# Patient Record
Sex: Female | Born: 1940 | Race: White | Hispanic: No | Marital: Married | Smoking: Never smoker
Health system: Southern US, Community
[De-identification: ages and names within clinical notes are randomized; demographics above are authoritative.]

## PROBLEM LIST (undated history)

## (undated) DIAGNOSIS — I10 Essential (primary) hypertension: Secondary | ICD-10-CM

## (undated) DIAGNOSIS — K5792 Diverticulitis of intestine, part unspecified, without perforation or abscess without bleeding: Secondary | ICD-10-CM

## (undated) DIAGNOSIS — M1990 Unspecified osteoarthritis, unspecified site: Secondary | ICD-10-CM

## (undated) DIAGNOSIS — K635 Polyp of colon: Secondary | ICD-10-CM

## (undated) DIAGNOSIS — B019 Varicella without complication: Secondary | ICD-10-CM

## (undated) DIAGNOSIS — E119 Type 2 diabetes mellitus without complications: Secondary | ICD-10-CM

## (undated) DIAGNOSIS — T7840XA Allergy, unspecified, initial encounter: Secondary | ICD-10-CM

## (undated) DIAGNOSIS — N189 Chronic kidney disease, unspecified: Secondary | ICD-10-CM

## (undated) HISTORY — DX: Varicella without complication: B01.9

## (undated) HISTORY — DX: Diverticulitis of intestine, part unspecified, without perforation or abscess without bleeding: K57.92

## (undated) HISTORY — DX: Chronic kidney disease, unspecified: N18.9

## (undated) HISTORY — DX: Unspecified osteoarthritis, unspecified site: M19.90

## (undated) HISTORY — DX: Essential (primary) hypertension: I10

## (undated) HISTORY — PX: BREAST SURGERY: SHX581

## (undated) HISTORY — DX: Polyp of colon: K63.5

## (undated) HISTORY — DX: Allergy, unspecified, initial encounter: T78.40XA

---

## 1999-08-14 ENCOUNTER — Encounter: Admission: RE | Admit: 1999-08-14 | Discharge: 1999-11-12 | Payer: Self-pay | Admitting: Emergency Medicine

## 1999-11-23 ENCOUNTER — Other Ambulatory Visit: Admission: RE | Admit: 1999-11-23 | Discharge: 1999-11-23 | Payer: Self-pay | Admitting: Obstetrics and Gynecology

## 2000-11-25 ENCOUNTER — Other Ambulatory Visit: Admission: RE | Admit: 2000-11-25 | Discharge: 2000-11-25 | Payer: Self-pay | Admitting: Obstetrics and Gynecology

## 2001-08-21 ENCOUNTER — Encounter: Payer: Self-pay | Admitting: Emergency Medicine

## 2001-08-21 ENCOUNTER — Encounter: Admission: RE | Admit: 2001-08-21 | Discharge: 2001-08-21 | Payer: Self-pay | Admitting: Emergency Medicine

## 2001-09-03 HISTORY — PX: KNEE ARTHROSCOPY: SHX127

## 2001-11-12 ENCOUNTER — Ambulatory Visit (HOSPITAL_COMMUNITY): Admission: RE | Admit: 2001-11-12 | Discharge: 2001-11-12 | Payer: Self-pay | Admitting: Emergency Medicine

## 2001-11-12 ENCOUNTER — Encounter: Payer: Self-pay | Admitting: Emergency Medicine

## 2001-11-26 ENCOUNTER — Ambulatory Visit (HOSPITAL_COMMUNITY): Admission: RE | Admit: 2001-11-26 | Discharge: 2001-11-26 | Payer: Self-pay | Admitting: Gastroenterology

## 2001-11-26 ENCOUNTER — Encounter (INDEPENDENT_AMBULATORY_CARE_PROVIDER_SITE_OTHER): Payer: Self-pay | Admitting: Specialist

## 2001-12-03 ENCOUNTER — Other Ambulatory Visit: Admission: RE | Admit: 2001-12-03 | Discharge: 2001-12-03 | Payer: Self-pay | Admitting: Obstetrics and Gynecology

## 2004-09-03 HISTORY — PX: OTHER SURGICAL HISTORY: SHX169

## 2005-08-24 ENCOUNTER — Encounter: Admission: RE | Admit: 2005-08-24 | Discharge: 2005-08-24 | Payer: Self-pay | Admitting: Emergency Medicine

## 2005-08-24 ENCOUNTER — Inpatient Hospital Stay (HOSPITAL_COMMUNITY): Admission: EM | Admit: 2005-08-24 | Discharge: 2005-08-29 | Payer: Self-pay | Admitting: Emergency Medicine

## 2005-09-26 ENCOUNTER — Encounter: Admission: RE | Admit: 2005-09-26 | Discharge: 2005-09-26 | Payer: Self-pay | Admitting: Emergency Medicine

## 2006-05-29 ENCOUNTER — Ambulatory Visit: Payer: Self-pay | Admitting: Internal Medicine

## 2006-06-05 ENCOUNTER — Ambulatory Visit: Payer: Self-pay

## 2006-06-05 ENCOUNTER — Encounter: Payer: Self-pay | Admitting: Cardiology

## 2006-09-03 HISTORY — PX: DILATION AND CURETTAGE OF UTERUS: SHX78

## 2007-03-19 ENCOUNTER — Encounter: Admission: RE | Admit: 2007-03-19 | Discharge: 2007-03-19 | Payer: Self-pay | Admitting: Emergency Medicine

## 2007-04-09 ENCOUNTER — Ambulatory Visit (HOSPITAL_BASED_OUTPATIENT_CLINIC_OR_DEPARTMENT_OTHER): Admission: RE | Admit: 2007-04-09 | Discharge: 2007-04-09 | Payer: Self-pay | Admitting: Obstetrics and Gynecology

## 2007-04-09 ENCOUNTER — Encounter (INDEPENDENT_AMBULATORY_CARE_PROVIDER_SITE_OTHER): Payer: Self-pay | Admitting: Obstetrics and Gynecology

## 2007-07-09 ENCOUNTER — Encounter: Payer: Self-pay | Admitting: Internal Medicine

## 2007-07-09 ENCOUNTER — Ambulatory Visit: Payer: Self-pay

## 2010-02-16 LAB — HM MAMMOGRAPHY

## 2011-01-16 NOTE — Op Note (Signed)
NAME:  Kelly Blake, Kelly Blake NO.:  [id number]   MEDICAL RECORD NO.:  [id number]          PATIENT TYPE:  AMB   LOCATION:  [facility]                         FACILITY:  [facility]   PHYSICIAN:  Malachi Pro. Ambrose Mantle, M.D. DATE OF BIRTH:  Dec 28, 1940   DATE OF PROCEDURE:  04/09/2007  DATE OF DISCHARGE:                               OPERATIVE REPORT   PREOPERATIVE DIAGNOSIS:  Postmenopausal bleeding, endometrial lesions,  submucous fibroid versus polyp.   POSTOPERATIVE DIAGNOSIS:  Submucous fibroid.   OPERATION:  D&C, hysteroscopic partial resection of a submucous fibroid.   OPERATOR:  Malachi Pro. Ambrose Mantle, M.D.   ANESTHESIA:  General anesthesia.   The patient was brought to the operating room and placed under  satisfactory general anesthesia, placed in lithotomy position.  Exam  revealed some pelvic relaxation, with a small rectocele.  The cervix was  atrophic.  The uterus was probably normal size.  The adnexa were free of  masses.  The vulva, vagina, and perineum were prepped with Betadine  solution and draped as a sterile field.  A weighted speculum was placed  posteriorly.  The cervix was grasped with a tenaculum, and the uterus  which was posterior was sounded with the smallest dilator.  It was then  dilated up to reasonable size so that we could admit the hysteroscope.  On visualizing the endometrial cavity, it was apparent that the patient  had a large structure filling the endometrial cavity and seemed to be  arising from the right lateral wall of the endometrial cavity.  I did a  fractional D&C, and then I dilated the cervix to a 35 Pratt dilator so  that I could insert the resectoscope.  I initially used a coag current  of 80, but that was insufficient, so I switched to a blend current of  110, and with that I did remove 25 fragments of the polyp, but its  position was difficult to get.  Since it was on the lateral wall, I had  more difficulty getting the resectoscope around the  body of the tumor.  I attempted to try to remove the hysteroscope as infrequently as  possible, but when I took the first two or three strips of tissue and  tried to recover them with the polyp forceps, I could not recover them,  so I did have to remove the hysteroscope after the resection.  Some of  the fragments were larger than others, but it was apparent that I could  not get the entire fibroid out, so some of the fibroid is left in place.  There was no significant bleeding at the end of the procedure.  The goal  of the surgery was not necessarily to remove the mass in the endometrial  cavity but to be positive that there was no malignancy.  When the  deficit reached 500, I stopped resecting and removed the hysteroscopic  instruments and terminated the procedure.  The patient seemed to  tolerate the procedure well and was returned to recovery in satisfactory  condition.      Malachi Pro. Ambrose Mantle, M.D.  Electronically  Signed     TFH/MEDQ  D:  04/09/2007  T:  04/09/2007  Job:  [id number]

## 2011-01-19 NOTE — Procedures (Signed)
[place]  Patient:    Kelly Blake, Kelly Blake Visit Number: [id number] MRN: [id number]          Service Type: END Location: ENDO Attending Physician:  Charna Elizabeth Dictated by:   Anselmo Rod, M.D. Proc. Date: 11/26/01 Admit Date:  11/26/2001   CC:         Reuben Likes, M.D.   Procedure Report  DATE OF BIRTH:  02-Dec-1940  REFERRING PHYSICIAN:  Reuben Likes, M.D.  PROCEDURE PERFORMED:  Colonoscopy with snare polypectomy x 1.  ENDOSCOPIST:  Anselmo Rod, M.D.  INSTRUMENT USED:  Olympus adjustable pediatric video colonoscope.  INDICATIONS FOR PROCEDURE:  Occasional blood in stool and history of recurrent left lower quadrant abdominal pain question diverticular disease in a 70 year old white female.  Rule out colonic polyps, masses, hemorrhoids, diverticulosis, etc.  PREPROCEDURE PREPARATION:  Informed consent was procured from the patient. The patient was fasted for eight hours prior to the procedure and prepped with a bottle of magnesium citrate and a gallon of NuLytely the night prior to the procedure.  PREPROCEDURE PHYSICAL:  The patient had stable vital signs.  Neck supple. Chest clear to auscultation.  S1, S2 regular.  Abdomen soft with normal bowel sounds.  DESCRIPTION OF PROCEDURE:  The patient was placed in the left lateral decubitus position and sedated with 60 mg of Demerol and 6 mg of Versed intravenously.  Once the patient was adequately sedated and maintained on low-flow oxygen and continuous cardiac monitoring, the Olympus video colonoscope was advanced from the rectum to the cecum without difficulty.  The appendiceal orifice and the ileocecal valve were clearly visualized and photographed.  A small flat polyp was snared by snare polypectomy forceps from the cecal base.  There were a few early left-sided diverticula appreciated on inspection of the left colon.  Small nonbleeding internal hemorrhoids  were appreciated on retroflexion in the rectum.  The rest of the colon appeared healthy and without lesions.  IMPRESSION: 1. Small nonbleeding internal hemorrhoids. 2. Early left-sided diverticulosis. 3. Flat polyp snared from the cecal base. 4. Otherwise normal-appearing right colon and transverse colon.  RECOMMENDATIONS: 1. Avoid all nonsteroidals for the next four weeks. 2. Await pathology results. 3. Outpatient follow-up in the next two weeks or earlier if need be. Dictated by:   Anselmo Rod, M.D. Attending Physician:  Charna Elizabeth DD:  11/26/01 TD:  11/26/01 Job: [id number]

## 2011-01-19 NOTE — Letter (Signed)
May 29, 2006     Reuben Likes, M.D.  [address]  [place], [place]  [address]   RE:  Blake, Kelly  MRN:  [id number]  /  DOB:  1940/10/22   Dear Onalee Hua,   It was a pleasure to talk to you today about Kelly Blake.  I am  grateful that you are willing to share with me some of what has been going  on with the evaluation of your son.   Kelly Blake, as you know, is a delightful 70 year old woman who was found  to have PVCs after Kelly Blake developed transient episodes of left-sided sharp  chest pain, each lasting for about five or 10 seconds, radiating out to her  left arm.  They then abated.  Kelly Blake then had sort of a diffuse ache that was  24/7, for about four or five days prior to her seeing you.  At that point  the electrocardiogram was done, demonstrating the PVCs.  A Holter monitor  was done which demonstrated that Kelly Blake had up to 10% of her beats being PVCs  and maybe 20% that some of these were called aberrant.   The patient has no clearly associated symptoms.  Kelly Blake exercises six days a  week and has had no problems with shortness of breath or chest discomfort.  Kelly Blake has had no lightheadedness or palpitations and no syncope.   PAST MEDICAL HISTORY:  1. Kelly Blake does have a past medical history of hypertension, for which Kelly Blake has      been on hydrochlorothiazide 50 mg daily for a long time.  2. Allergies.  3. A bowel disorder.   PAST SURGICAL HISTORY:  Notable for knee surgery.   SOCIAL HISTORY:  Kelly Blake is married.  Kelly Blake comes with her husband today.  Kelly Blake has  three children.  Kelly Blake does not use cigarettes or recreational drugs.  Kelly Blake  does use alcohol, one to two per day.   CURRENT MEDICATIONS:  1. Hydrochlorothiazide 50 mg daily.  2. A prescription for metoprolol, which Kelly Blake has not yet filled.   ALLERGIES:  No known drug allergies.   PHYSICAL EXAMINATION:  VITAL SIGNS:  Blood pressure elevated at 152/86,  pulse 71.  HEENT:  No icterus or xanthomata.  NECK:   Veins are flat.  The carotids are brisk and full bilaterally without  bruits.  BACK:  Without kyphosis or scoliosis.  LUNGS:  Clear.  HEART:  Sounds regular without murmurs or gallops.  ABDOMEN:  Soft without midline pulsation or hepatomegaly.  PULSES:  Femoral pulses 2+, distal pulses intact.  EXTREMITIES:  No clubbing, cyanosis or edema.  NEUROLOGIC:  Grossly normal.  SKIN:  Warm and dry.   An electrocardiogram dated today demonstrated sinus rhythm at 71, with  interval of 0.18/0.08/0.4.  The axis was 90 degrees.   IMPRESSION:  1. Premature ventricular contractions - relatively frequent.  2. Atypical chest pains with good exercise tolerance.  3. Hypertension - systolic.  4. Hydrochlorothiazide therapy.   Onalee Hua, in the context of Kelly Blake's PVCs being to her asymptomatic,  therapy I think should be directed only in the event that we see some  untoward consequences of it.  There are data demonstrating an association  between frequent PVCs and left ventricular dysfunction; and so looking at  that potential cause/effect issue, I think it is critical to decide whether  any therapies need to be entertained.  Assuming that her echocardiogram is  normal, I think it would be reasonable  to repeat an ultrasound in a year or  so, to make sure that there is no interval development of LV dysfunction.   As it relates to her hypertension, there are data recently that suggest that  50 mg of hydrochlorothiazide may be less effective, relative to a mortality  benefit than the lower dose.  An ARB may also be beneficial as it relates to  ventricular and atrial remodeling.  To that end, as we talked about, will  plan to put her on an ARB/hydrochlorothiazide combination when Kelly Blake comes in  for an echocardiogram.   Kelly Blake is to see you about one month after that for blood pressure and BMET  checks.  We will plan to see her again, in the event that her LV function is  not normal and would ask you to  have her re-ultrasounded in about one year's  time, to make sure that there is no interval development of left ventricular  dysfunction.   Onalee Hua, thanks very much for asking [place] to help in her care.    Sincerely,      Kelly Salvia, MD, [facility]     SCK/MedQ  DD:  05/29/2006  DT:  05/31/2006  Job #:  [id number]

## 2011-01-19 NOTE — Discharge Summary (Signed)
NAME:  Kelly Blake, Kelly Blake             ACCOUNT NO.:  [id number]   MEDICAL RECORD NO.:  [id number]          PATIENT TYPE:  INP   LOCATION:  5037                         FACILITY:  [facility]   PHYSICIAN:  Jonna L. Robb Matar, M.D.DATE OF BIRTH:  [birth year]   DATE OF ADMISSION:  08/24/2005  DATE OF DISCHARGE:  08/29/2005                                 DISCHARGE SUMMARY   PRIMARY CARE PHYSICIAN:  Dr. Leslee Home.   FINAL DIAGNOSES:  1.  Diverticular abscess.  2.  Hypertension.   ALLERGIES:  ERYTHROMYCIN.   CODE STATUS:  Fall.   HISTORY:  This 70 year old Caucasian female had a one-week history of crampy  lower abdominal pain.  On August 21, 2005, she just had some mild  diverticulitis on her CT scan, but she was doing worse, and repeat scan on  August 24, 2005 showed that she had a small abscess with an air/fluid  level that was not big enough to be drained.  Colonoscopy 3-1/2 years ago  showed diverticulosis.   PHYSICAL EXAMINATION:  On admission, was notable for a localized area of  tenderness in the left lower quadrant.   HOSPITAL COURSE:  The patient was put on clear liquids, IV Cipro and Flagyl  and she gradually improved.  Repeat CT of the abdomen and pelvis on August 28, 2005 no longer showed any abscess.  There was still some localized  inflammation.   DISPOSITION:  The patient will be discharged on Cipro 500 b.i.d. through  September 02, 2005.  Metronidazole 500 t.i.d. through September 02, 2005, and  her regular medications of hydrochlorothiazide 50 mg (She will take a half a  day until eating regularly,) Evista 60 daily, calcium 500 daily, magnesium  daily.  She is to stay on a full liquid diet and very soft pasta only.  She  is not to eat any pits, seeds, or husks.  She is to call Dr. Lorenz Coaster and be  seen in one week.  She also had a routine colonoscopy scheduled for the  beginning of January, but I have told her to put that off until March to  allow the abscess time  to heal.      Jonna L. Robb Matar, M.D.  Electronically Signed    JLB/MEDQ  D:  08/29/2005  T:  08/29/2005  Job:  [id number]   cc:   Reuben Likes, M.D.  Fax: [phone]

## 2011-01-19 NOTE — H&P (Signed)
NAME:  Kelly Blake, Kelly Blake             ACCOUNT NO.:  [id number]   MEDICAL RECORD NO.:  [id number]          PATIENT TYPE:  INP   LOCATION:  5037                         FACILITY:  [facility]   PHYSICIAN:  Jonna L. Robb Matar, M.D.DATE OF BIRTH:  03-09-1941   DATE OF ADMISSION:  08/24/2005  DATE OF DISCHARGE:                                HISTORY & PHYSICAL   PRIMARY CARE PHYSICIAN:  Dr. Leslee Home   CODE STATUS:  Full.   CHIEF COMPLAINT:  Abdominal pain.   HISTORY:  This 70 year old Caucasian woman has been having a 1-week history  of crampy lower abdominal pain not accompanied by fever, chills, nausea,  vomiting, diarrhea, constipation, hematemesis, or melena. She went to see  her physician who did a CAT scan and found that she had a diverticular  abscess for which she is being admitted for treatment. She had a colonoscopy  3 years ago and was told that she had diverticulosis but this is the first  time she has had any activation.   PAST MEDICAL HISTORY:  1.  Hypertension.  2.  Osteoporosis and breast cancer prevention.   ALLERGIES:  ERYTHROMYCIN makes her hands itch.   OPERATIONS:  1.  Removal of a right breast cyst.  2.  Right arthroscopic knee surgery.   MEDICATIONS:  1.  Hydrochlorothiazide 50 mg.  2.  Evista 60 mg p.o. daily.  3.  Calcium 500 mg p.o. daily.  4.  Magnesium oxide 140 mg daily.   FAMILY HISTORY:  Breast cancer in her mother, diabetes in her father. One  sister is alive and well. Three sons are healthy except for gout.   SOCIAL HISTORY:  Married, three sons. Retired Librarian, academic. Nonsmoker,  wine with dinner, occasional hot toddy.   REVIEW OF SYSTEMS:  No fever, chills, weight changes, or visual problems. No  sore throat or coughing. No shortness of breath, lung problems. No heart  disease, heart attacks, blood clots. No kidney stones or dysuria or  frequency. She had a cyst in the right breast which was removed. No personal  history of diabetes or  thyroid. No unusual bleeding, no rashes. Just some  mild arthritis in her knees and hands. No seizures, strokes. Other systems  reviewed and were negative.   PHYSICAL EXAMINATION:  VITAL SIGNS:  Temperature 98.5, pulse 72,  respirations 20, blood pressure 156/85.  GENERAL:  Well-developed, fairly comfortable white female.  HEENT:  Conjunctivae and lids are normal. Pupils are reactive. Extraocular  movements are full. Normal hearing, mucosa, pharynx.  NECK:  Supple without masses, thyromegaly, or carotid bruits.  RESPIRATORY:  Effort is normal. The lungs are clear to A&P without wheezing,  rales, rhonchi, or dullness.  HEART:  Has a regular rate and rhythm, occasional extra systole. Normal S1  and S2 without murmurs, rubs, or gallops. Pulses were without bruits. There  was no clubbing, cyanosis, or edema.  BREASTS:  Show no masses, tenderness, or discharge. There is a small scar on  the right at the 9 o'clock position.  ABDOMEN:  Normal bowel sounds. No hepatosplenomegaly or hernia. There is a  localized area  of tenderness in the left lower quadrant. No rebound or  guarding.  EXTERNAL GENITALIA:  Normal. There is no inguinal adenopathy.  MUSCULOSKELETAL:  Muscle strength is 5/5 with full range of motion all four  extremities.  SKIN:  Shows no rash, lesions, nodules.  NEUROLOGIC:  Cranial nerves are intact. DTRs are 2+. Toes go down. Sensation  is normal. She is alert and oriented x3; normal memory, judgment, and  affect.   LABORATORY DATA:  Pending.   IMPRESSION:  1.  Acute diverticular abscess. She will be put on clear liquids, Cipro, and      Flagyl, and will re-x-ray it in several days to see if the abscess is      getting smaller.  2.  Hypertension.  3.  Prophylaxis for osteoporosis and family history of breast cancer.      Jonna L. Robb Matar, M.D.  Electronically Signed     JLB/MEDQ  D:  08/24/2005  T:  08/26/2005  Job:  [id number]   cc:   Reuben Likes, M.D.  Fax:  [phone]

## 2011-04-04 ENCOUNTER — Ambulatory Visit: Payer: Self-pay | Admitting: Family Medicine

## 2011-04-05 ENCOUNTER — Ambulatory Visit (INDEPENDENT_AMBULATORY_CARE_PROVIDER_SITE_OTHER): Payer: Medicare Other | Admitting: Family Medicine

## 2011-04-05 DIAGNOSIS — I10 Essential (primary) hypertension: Secondary | ICD-10-CM

## 2011-04-05 DIAGNOSIS — IMO0001 Reserved for inherently not codable concepts without codable children: Secondary | ICD-10-CM

## 2011-04-05 DIAGNOSIS — Z719 Counseling, unspecified: Secondary | ICD-10-CM

## 2011-04-05 DIAGNOSIS — E119 Type 2 diabetes mellitus without complications: Secondary | ICD-10-CM | POA: Insufficient documentation

## 2011-04-05 NOTE — Progress Notes (Signed)
  Subjective:    Patient ID: Kelly Blake, female    DOB: November 15, 1940, 70 y.o.   MRN: [id number]  HPI Patient seen today for consultation. She is considering transfer of care to this practice but has several questions first. Past medical history significant for hypertension and type 2 diabetes.  Has had prediabetes for several years but recently diagnosed with diabetes.  On metformin. Takes Benicar HCTZ 40/12.5 one daily for hypertension which has been well-controlled. Exercises 3-4 days per week.  She takes Evista but denies history of osteoporosis or osteopenia. Positive family history breast cancer mother   Review of Systems  Constitutional: Negative for fever, chills, appetite change and unexpected weight change.  Respiratory: Negative for cough and shortness of breath.   Cardiovascular: Negative for chest pain, palpitations and leg swelling.  Neurological: Negative for dizziness and headaches.  Hematological: Negative for adenopathy.       Objective:   Physical Exam  Constitutional: She is oriented to person, place, and time. She appears well-developed and well-nourished. No distress.  Neurological: She is alert and oriented to person, place, and time.  Psychiatric: She has a normal mood and affect.          Assessment & Plan:  #1 hypertension. Send for old records #2 type 2 diabetes. Reported recent A1c 6.2%.  Irregular exercise. Patient had some previous education regarding diabetic diet #3 positive family history breast cancer. Continue Evista.

## 2011-04-08 ENCOUNTER — Encounter: Payer: Self-pay | Admitting: Family Medicine

## 2011-04-19 ENCOUNTER — Encounter: Payer: Self-pay | Admitting: Family Medicine

## 2011-06-18 LAB — COMPREHENSIVE METABOLIC PANEL
AST: 24
BUN: 22
CO2: 27
Calcium: 9.8
Chloride: 103
Creatinine, Ser: 0.83
GFR calc Af Amer: >60
GFR calc non Af Amer: >60
Glucose, Bld: 102 — ABNORMAL HIGH
Total Bilirubin: 0.6

## 2011-06-18 LAB — CBC
HCT: 37.9
Hemoglobin: 12.8
MCHC: 33.7
MCV: 95.3
RBC: 3.98
WBC: 7.7

## 2011-06-18 LAB — DIFFERENTIAL
Basophils Absolute: 0
Eosinophils Relative: 2
Lymphocytes Relative: 23
Lymphs Abs: 1.8
Neutro Abs: 5.3
Neutrophils Relative %: 68

## 2011-06-21 ENCOUNTER — Ambulatory Visit (INDEPENDENT_AMBULATORY_CARE_PROVIDER_SITE_OTHER): Payer: Medicare Other | Admitting: Family Medicine

## 2011-06-21 ENCOUNTER — Encounter: Payer: Self-pay | Admitting: Family Medicine

## 2011-06-21 VITALS — BP 122/72 | HR 72 | Temp 98.1°F | Resp 12 | Ht 60.5 in | Wt 133.0 lb

## 2011-06-21 DIAGNOSIS — E119 Type 2 diabetes mellitus without complications: Secondary | ICD-10-CM

## 2011-06-21 DIAGNOSIS — Z23 Encounter for immunization: Secondary | ICD-10-CM

## 2011-06-21 DIAGNOSIS — Z0000 Encounter for general adult medical examination without abnormal findings: Secondary | ICD-10-CM

## 2011-06-21 DIAGNOSIS — I10 Essential (primary) hypertension: Secondary | ICD-10-CM

## 2011-06-21 LAB — HEMOGLOBIN A1C: Hgb A1c MFr Bld: 6.5 % (ref 4.6–6.5)

## 2011-06-21 LAB — BASIC METABOLIC PANEL
CO2: 29 mEq/L (ref 19–32)
Chloride: 105 mEq/L (ref 96–112)
Creatinine, Ser: 0.6 mg/dL (ref 0.4–1.2)
Sodium: 141 mEq/L (ref 135–145)

## 2011-06-21 LAB — HEPATIC FUNCTION PANEL
ALT: 15 U/L (ref 0–35)
AST: 20 U/L (ref 0–37)
Albumin: 4.2 g/dL (ref 3.5–5.2)

## 2011-06-21 LAB — LIPID PANEL
Cholesterol: 217 mg/dL — ABNORMAL HIGH (ref 0–200)
HDL: 58 mg/dL (ref >39.00)
Total CHOL/HDL Ratio: 4
Triglycerides: 86 mg/dL (ref 0.0–149.0)

## 2011-06-21 LAB — LDL CHOLESTEROL, DIRECT: Direct LDL: 142.3 mg/dL

## 2011-06-21 NOTE — Patient Instructions (Signed)
Increase dietary calcium to 1200 mg daily Continue regular vitamin D supplementation Continue regular weightbearing exercise

## 2011-06-21 NOTE — Progress Notes (Signed)
Subjective:    Patient ID: Kelly Blake, female    DOB: [birth year], 70 y.o.   MRN: [id number]  HPI Patient here for medical followup and Medicare wellness exam. She has hypertension and type 2 diabetes. Diabetes been well controlled. Metformin 500 mg once daily. No symptoms of hyperglycemia. She does not recall last A1c. Takes Benicar HCTZ for hypertension. No orthostasis or dizziness. Blood pressure well-controlled. Compliant with all medications.  Patient sees gynecologist regularly. History of Pneumovax and shingles vaccine. Last tetanus around 10 years ago. Takes vitamin D and calcium. Colonoscopy 4 years ago with colon polyps. Repeated next year. Mammogram tomorrow. Exercises 3 days per week. Pap smear 2 years ago per her gynecologist and she plans to wait until next year. No history of abnormal Pap smears.  1.  Risk factors based on Past Medical , Social, and Family history  History reviewed and as below. 2.  Limitations in physical activities  Very active and no limitations  3.  Depression/mood No depression or anxiety. 4.  Hearing  No deficits. 5.  ADLs Fully independent in all. 6.  Cognitive function (orientation to time and place, language, writing, speech,memory) Short and long term memory intact.  Language intact.  Normal judgement. 7.  Home Safety  No issues. 8.  Height, weight, and visual acuity.  All stable. 9.  Counseling  Discussed exercise, Ca/Vit D intake, immunizations 10. Recommendation of preventive services.  Tdap and flu vaccines.  Mammogram per gyn. 11. Labs based on risk factors  Lipid, hepatic, BMP. 12. Care Plan  As above.  Needs to increase calcium intake.  Past Medical History  Diagnosis Date  . Arthritis   . Chicken pox   . Diverticulitis   . Allergy   . Hypertension   . Chronic kidney disease     stones  . Colon polyps    Past Surgical History  Procedure Date  . Knee arthroscopy 2003    torn meniscus  . Diverticulitis 2006    with abcess  .  Dilation and curettage of uterus 2008    reports that she has never smoked. She does not have any smokeless tobacco history on file. She reports that she does not use illicit drugs. Her alcohol history not on file. family history includes Alzheimer's disease in her other; Cancer in her mother and other; Diabetes in her father and other; and Stroke in her other. No Known Allergies     Review of Systems  Constitutional: Negative for fever, activity change, appetite change, fatigue and unexpected weight change.  HENT: Negative for hearing loss, ear pain, sore throat and trouble swallowing.   Eyes: Negative for visual disturbance.  Respiratory: Negative for cough and shortness of breath.   Cardiovascular: Negative for chest pain and palpitations.  Gastrointestinal: Negative for abdominal pain, diarrhea, constipation and blood in stool.  Genitourinary: Negative for dysuria and hematuria.  Musculoskeletal: Negative for myalgias, back pain and arthralgias.  Skin: Negative for rash.  Neurological: Negative for dizziness, syncope and headaches.  Hematological: Negative for adenopathy.  Psychiatric/Behavioral: Negative for confusion and dysphoric mood.       Objective:   Physical Exam  Constitutional: She is oriented to person, place, and time. She appears well-developed and well-nourished.  HENT:  Head: Normocephalic and atraumatic.  Eyes: EOM are normal. Pupils are equal, round, and reactive to light.  Neck: Normal range of motion. Neck supple. No thyromegaly present.  Cardiovascular: Normal rate, regular rhythm and normal heart sounds.   No murmur  heard. Pulmonary/Chest: Breath sounds normal. No respiratory distress. She has no wheezes. She has no rales.  Abdominal: Soft. Bowel sounds are normal. She exhibits no distension and no mass. There is no tenderness. There is no rebound and no guarding.  Genitourinary:       Breasts symmetric without masses.  Musculoskeletal: Normal range of  motion. She exhibits no edema.  Lymphadenopathy:    She has no cervical adenopathy.  Neurological: She is alert and oriented to person, place, and time. She displays normal reflexes. No cranial nerve deficit.  Skin: No rash noted.  Psychiatric: She has a normal mood and affect. Her behavior is normal. Judgment and thought content normal.          Assessment & Plan:  #1 health maintenance. Tetanus booster and flu vaccines given. Other immunizations up to date. She will repeat Pap smear next year. Continue yearly mammograms. Colonoscopy next year #2 hypertension stable.  Continue with current medication. #3 type 2 diabetes. Recheck A1c. Continue regular exercise

## 2011-06-22 LAB — HM MAMMOGRAPHY: HM Mammogram: NEGATIVE

## 2011-06-26 ENCOUNTER — Encounter: Payer: Self-pay | Admitting: Family Medicine

## 2011-06-27 ENCOUNTER — Telehealth: Payer: Self-pay | Admitting: Family Medicine

## 2011-06-27 DIAGNOSIS — E785 Hyperlipidemia, unspecified: Secondary | ICD-10-CM

## 2011-06-27 MED ORDER — ATORVASTATIN CALCIUM 10 MG PO TABS: 10.0000 mg | ORAL_TABLET | Freq: Every day | ORAL | Status: DC

## 2011-06-27 NOTE — Telephone Encounter (Signed)
Pt informed, lipitor called in, copy of labs mailed to pt home

## 2011-06-27 NOTE — Progress Notes (Signed)
Quick Note:  Pt informed, future lab ordered, Rx sent to pharmacy, copy mailed to pt home ______

## 2011-06-27 NOTE — Telephone Encounter (Signed)
Pt had labs and physical on 10/18. She is wanting a call back with her lab results. Please call her at number provided today. Thank you.

## 2011-06-29 NOTE — Telephone Encounter (Signed)
Labs printed and ready for pick up  

## 2011-06-29 NOTE — Telephone Encounter (Signed)
Pt called and said that she still has not rcvd copy of lab results. Pt is req to pick up a copy today.

## 2011-09-20 ENCOUNTER — Other Ambulatory Visit: Payer: Self-pay

## 2011-09-20 MED ORDER — ATORVASTATIN CALCIUM 10 MG PO TABS: 10.0000 mg | ORAL_TABLET | Freq: Every day | ORAL | Status: DC

## 2011-10-01 ENCOUNTER — Telehealth: Payer: Self-pay | Admitting: Family Medicine

## 2011-10-01 MED ORDER — METFORMIN HCL 500 MG PO TABS: 500.0000 mg | ORAL_TABLET | Freq: Every day | ORAL | Status: DC

## 2011-10-01 NOTE — Telephone Encounter (Signed)
Pt req script for metFORMIN (GLUCOPHAGE) 500 MG tablet to be sent to [facility] Phone # [phone].

## 2012-01-01 ENCOUNTER — Other Ambulatory Visit: Payer: Self-pay | Admitting: *Deleted

## 2012-01-01 MED ORDER — METFORMIN HCL 500 MG PO TABS: 500.0000 mg | ORAL_TABLET | Freq: Every day | ORAL | Status: DC

## 2012-03-28 ENCOUNTER — Telehealth: Payer: Self-pay | Admitting: Family Medicine

## 2012-03-28 ENCOUNTER — Ambulatory Visit: Payer: Medicare Other | Admitting: Internal Medicine

## 2012-03-28 ENCOUNTER — Ambulatory Visit: Payer: Medicare Other | Admitting: Family Medicine

## 2012-03-28 NOTE — Telephone Encounter (Signed)
Caller: [name]/Patient; PCP: [name]; CB#: [phone];  Call regarding Injury/Trauma to R shoulder this AM at 1000 and now she is having pain with moving it. Triaged Shoulder Injury and all emergent SX RO.  Disp = needs to be seen in 4 hrs for pain with movement.  Called ofc adn spoke with [facility] to ck for X-Ray availablitlity.  Transferred pt to her.

## 2012-03-28 NOTE — Telephone Encounter (Signed)
Caller: [name]/Patient; PCP: Burchette, Bruce; CB#: [phone];  Call regarding Injury/Trauma to R shoulder this AM at 1000 and now she is having pain with moving it. Triaged Shoulder Injury and all emergent SX RO.  Disp = needs to be seen in 4 hrs for pain with movement.  Called ofc and spoke with Holly to ck for X-Ray availablitlity.  Transferred pt to her.  °

## 2012-03-28 NOTE — Telephone Encounter (Signed)
Kelly Blake 03/28/2012 2:01 PM Signed  Caller: Kelly Blake/Patient; PCP: [name]; CB#: [phone]; Call regarding Injury/Trauma to R shoulder this AM at 1000 and now she is having pain with moving it. Triaged Shoulder Injury and all emergent SX RO. Disp = needs to be seen in 4 hrs for pain with movement. Called ofc and spoke with [facility] to ck for X-Ray availablitlity. Transferred pt to her.   Kelly Blake scheduled appt with Dr. Clent Ridges for 03-28-12

## 2012-03-31 ENCOUNTER — Ambulatory Visit (INDEPENDENT_AMBULATORY_CARE_PROVIDER_SITE_OTHER): Payer: Medicare Other | Admitting: Family Medicine

## 2012-03-31 ENCOUNTER — Encounter: Payer: Self-pay | Admitting: Family Medicine

## 2012-03-31 VITALS — BP 142/70 | Temp 97.8°F | Wt 133.0 lb

## 2012-03-31 DIAGNOSIS — R5383 Other fatigue: Secondary | ICD-10-CM

## 2012-03-31 DIAGNOSIS — E119 Type 2 diabetes mellitus without complications: Secondary | ICD-10-CM

## 2012-03-31 DIAGNOSIS — R55 Syncope and collapse: Secondary | ICD-10-CM

## 2012-03-31 DIAGNOSIS — I10 Essential (primary) hypertension: Secondary | ICD-10-CM

## 2012-03-31 DIAGNOSIS — R5381 Other malaise: Secondary | ICD-10-CM

## 2012-03-31 LAB — CBC WITH DIFFERENTIAL/PLATELET
Eosinophils Relative: 3.5 % (ref 0.0–5.0)
Lymphocytes Relative: 21.6 % (ref 12.0–46.0)
Monocytes Relative: 5.4 % (ref 3.0–12.0)
Neutrophils Relative %: 69.4 % (ref 43.0–77.0)
Platelets: 190 10*3/uL (ref 150.0–400.0)
RBC: 3.66 Mil/uL — ABNORMAL LOW (ref 3.87–5.11)
WBC: 6.7 10*3/uL (ref 4.5–10.5)

## 2012-03-31 LAB — TSH: TSH: 1.48 u[IU]/mL (ref 0.35–5.50)

## 2012-03-31 LAB — BASIC METABOLIC PANEL
BUN: 21 mg/dL (ref 6–23)
Creatinine, Ser: 0.7 mg/dL (ref 0.4–1.2)
GFR: 93.59 mL/min (ref >60.00)
Potassium: 4.2 mEq/L (ref 3.5–5.1)

## 2012-03-31 NOTE — Progress Notes (Signed)
  Subjective:    Patient ID: Kelly Blake, female    DOB: [birth year], 71 y.o.   MRN: [id number]  HPI  Patient here for followup brief syncopal episode. This occurred last Friday. She had gone to the gym and worked out and had had already one cup of yogurt that morning before exercising. She was drinking water and fluids regularly and did not feel dehydrated. She returned home he was leaning over in her kitchen prior to syncope. This lasted apparently only seconds. No seizure activity. Landed on right shoulder. Went to urgent care Center. X-rays of shoulder negative. No other lab testing and no EKG done. She's had some intermittent lightheadedness since then but no recurrent syncope. No recent headaches. No focal weakness. No slurred speech. No transit visual difficulties.  She had recent [employer] screening with no carotid artery obstruction. Denies recent chest pain. No palpitations. Takes Benicar HCT for hypertension. No consistent orthostatic symptoms. Takes metformin for diabetes.  Past Medical History  Diagnosis Date  . Arthritis   . Chicken pox   . Diverticulitis   . Allergy   . Hypertension   . Chronic kidney disease     stones  . Colon polyps    Past Surgical History  Procedure Date  . Knee arthroscopy 2003    torn meniscus  . Diverticulitis 2006    with abcess  . Dilation and curettage of uterus 2008    reports that she has never smoked. She does not have any smokeless tobacco history on file. She reports that she does not use illicit drugs. Her alcohol history not on file. family history includes Alzheimer's disease in her other; Cancer in her mother and other; Diabetes in her father and other; and Stroke in her other. No Known Allergies     Review of Systems  Constitutional: Negative for fever, chills, appetite change and unexpected weight change.  Respiratory: Negative for cough and shortness of breath.   Cardiovascular: Negative for chest pain, palpitations and  leg swelling.  Gastrointestinal: Negative for abdominal pain.  Genitourinary: Negative for dysuria.  Neurological: Positive for dizziness and syncope. Negative for seizures, weakness, numbness and headaches.       Objective:   Physical Exam  Constitutional: She is oriented to person, place, and time. She appears well-developed and well-nourished.  HENT:  Mouth/Throat: Oropharynx is clear and moist.  Eyes: Pupils are equal, round, and reactive to light.  Neck: Neck supple. No thyromegaly present.       NO carotid bruits.  Cardiovascular: Normal rate and regular rhythm.  Exam reveals no gallop.   Pulmonary/Chest: Effort normal and breath sounds normal. No respiratory distress. She has no wheezes. She has no rales.  Musculoskeletal: She exhibits no edema.  Lymphadenopathy:    She has no cervical adenopathy.  Neurological: She is alert and oriented to person, place, and time. She has normal reflexes. No cranial nerve deficit. Coordination normal.          Assessment & Plan:  Syncopal episode. Question transient hypotension. None confirmed today. Blood pressure seated left arm 140/70 and standing 138/70.  No suspicion for seizure activity. Doubt arrhythmia. Start with EKG. Basic lab work. Consider event monitor if any recurrence or if dizziness persists  EKG shows frequent PVCs.  She has persistent dizziness off and on, though no recurrent syncope.  Will set up event monitor.

## 2012-04-02 NOTE — Progress Notes (Signed)
Quick Note:  Pt informed, and she requested a copy of her labs. Pt will schedule return OV in 1 month ______

## 2012-04-10 ENCOUNTER — Encounter: Payer: Self-pay | Admitting: Internal Medicine

## 2012-04-10 ENCOUNTER — Encounter (INDEPENDENT_AMBULATORY_CARE_PROVIDER_SITE_OTHER): Payer: Medicare Other

## 2012-04-10 DIAGNOSIS — R55 Syncope and collapse: Secondary | ICD-10-CM

## 2012-04-28 ENCOUNTER — Ambulatory Visit (INDEPENDENT_AMBULATORY_CARE_PROVIDER_SITE_OTHER): Payer: Medicare Other | Admitting: Family Medicine

## 2012-04-28 ENCOUNTER — Encounter: Payer: Self-pay | Admitting: Family Medicine

## 2012-04-28 VITALS — BP 152/82 | Temp 98.0°F | Wt 135.0 lb

## 2012-04-28 DIAGNOSIS — E119 Type 2 diabetes mellitus without complications: Secondary | ICD-10-CM

## 2012-04-28 DIAGNOSIS — D649 Anemia, unspecified: Secondary | ICD-10-CM

## 2012-04-28 DIAGNOSIS — I10 Essential (primary) hypertension: Secondary | ICD-10-CM

## 2012-04-28 DIAGNOSIS — R55 Syncope and collapse: Secondary | ICD-10-CM

## 2012-04-28 LAB — CBC WITH DIFFERENTIAL/PLATELET
Basophils Relative: 0.3 % (ref 0.0–3.0)
Eosinophils Relative: 2.8 % (ref 0.0–5.0)
HCT: 35.8 % — ABNORMAL LOW (ref 36.0–46.0)
Hemoglobin: 11.9 g/dL — ABNORMAL LOW (ref 12.0–15.0)
Lymphs Abs: 1.5 10*3/uL (ref 0.7–4.0)
Monocytes Relative: 7.4 % (ref 3.0–12.0)
Neutro Abs: 4.1 10*3/uL (ref 1.4–7.7)
Platelets: 194 10*3/uL (ref 150.0–400.0)
RBC: 3.77 Mil/uL — ABNORMAL LOW (ref 3.87–5.11)
WBC: 6.2 10*3/uL (ref 4.5–10.5)

## 2012-04-28 LAB — HEMOGLOBIN A1C: Hgb A1c MFr Bld: 6.2 % (ref 4.6–6.5)

## 2012-04-28 NOTE — Progress Notes (Signed)
  Subjective:    Patient ID: Kelly Blake, female    DOB: 05/20/1941, 71 y.o.   MRN: [id number]  HPI  Seen for followup. Refer to prior note. She had syncopal episode. She has event monitor placed at this time through this Thursday. No recurrent syncope. No further dizziness. She was on Benicar HCTZ following last visit. Blood pressures at home stable with 130 systolic and 80s diastolic. Patient also reduced her Lipitor to one half tablet daily because of some joint aches and thus far has not seeing any change (in symptoms). Blood sugars stable.  Recent lab significant for hemoglobin 11.6 with normal MCV. Patient had previous colonoscopy about 5 years ago.  Past Medical History  Diagnosis Date  . Arthritis   . Chicken pox   . Diverticulitis   . Allergy   . Hypertension   . Chronic kidney disease     stones  . Colon polyps    Past Surgical History  Procedure Date  . Knee arthroscopy 2003    torn meniscus  . Diverticulitis 2006    with abcess  . Dilation and curettage of uterus 2008    reports that she has never smoked. She does not have any smokeless tobacco history on file. She reports that she does not use illicit drugs. Her alcohol history not on file. family history includes Alzheimer's disease in her other; Cancer in her mother and other; Diabetes in her father and other; and Stroke in her other. No Known Allergies    Review of Systems  Constitutional: Negative for fatigue.  Eyes: Negative for visual disturbance.  Respiratory: Negative for cough, chest tightness, shortness of breath and wheezing.   Cardiovascular: Negative for chest pain, palpitations and leg swelling.  Neurological: Negative for dizziness, seizures, syncope, weakness, light-headedness and headaches.       Objective:   Physical Exam  Constitutional: She is oriented to person, place, and time. She appears well-developed and well-nourished.  HENT:  Mouth/Throat: Oropharynx is clear and moist.    Neck: Neck supple. No thyromegaly present.  Cardiovascular: Normal rate and regular rhythm.  Exam reveals no gallop.   Pulmonary/Chest: Effort normal and breath sounds normal. No respiratory distress. She has no wheezes. She has no rales.  Musculoskeletal: She exhibits no edema.  Lymphadenopathy:    She has no cervical adenopathy.  Neurological: She is alert and oriented to person, place, and time. No cranial nerve deficit.  Psychiatric: She has a normal mood and affect. Her behavior is normal.          Assessment & Plan:  #1 recent syncopal episode. No further symptoms.  Event monitor pending  #2 normocytic anemia. Recheck CBC today and also check ferritin and serum iron. #3 hypertension. Controlled by home readings but elevated today. Continue close monitoring. She'll bring her cuff to check with ours.  If consistently over 140/90 consider going back to plain Benicar #4 type 2 diabetes. Recheck hemoglobin A1c

## 2012-04-28 NOTE — Patient Instructions (Addendum)
Bring your BP cuff at next visit Be in touch if BP is consistently over 140/90

## 2012-04-29 NOTE — Progress Notes (Signed)
Quick Note:  Left a message for pt to return call. ______ 

## 2012-04-30 NOTE — Progress Notes (Signed)
Quick Note:  Pt informed, labs mailed to home as requested ______

## 2012-05-02 ENCOUNTER — Telehealth: Payer: Self-pay | Admitting: Family Medicine

## 2012-05-02 NOTE — Telephone Encounter (Signed)
Pls advise.  

## 2012-05-02 NOTE — Telephone Encounter (Signed)
Pt called and is wondering when she needed to sch fup visit with pcp and if she can get her cholesterol lvls checked at next visit?

## 2012-05-05 NOTE — Telephone Encounter (Signed)
Follow up 3 months and check lipids then.

## 2012-05-06 NOTE — Telephone Encounter (Signed)
Left a message for pt to return call 

## 2012-05-07 NOTE — Telephone Encounter (Signed)
Called pt and made aware of Dr. Lucie Leather recommendations. Pt states she will call back in December and make an appt.

## 2012-05-07 NOTE — Telephone Encounter (Signed)
Left a message for return call.  

## 2012-05-20 ENCOUNTER — Telehealth: Payer: Self-pay | Admitting: Family Medicine

## 2012-05-20 NOTE — Telephone Encounter (Signed)
Caller: [name]/Patient; Patient Name: Kelly Blake; PCP: [name] [facility]); Best Callback Phone Number: [phone]; Reason for call: Abdominal pain that began on Thursday 05/15/12. Lower abdominal pain at intervals that may last 5-10 mins and then goes away. "Pain almost doubles me over." Then in 2-3 hours, pain returns. Pain at 5-6/10 when it occurs.  Having small bowel movements, but decreased in amounts. Caller denies nausea, vomiting and is afebrile. Appetite is normal for caller. Painfree at present. Per Abdominal Pain Protocol, New onset mild or aching lower abdominal pain, See MD in 24 hrs. Appointment scheduled for Wednesday 9/18 at 9:30 with Dr Caryl Never. Caller is agreeable and was advised to callback as needed.

## 2012-05-21 ENCOUNTER — Encounter: Payer: Self-pay | Admitting: Family Medicine

## 2012-05-21 ENCOUNTER — Ambulatory Visit (INDEPENDENT_AMBULATORY_CARE_PROVIDER_SITE_OTHER): Payer: Medicare Other | Admitting: Family Medicine

## 2012-05-21 VITALS — BP 140/70 | Temp 98.0°F | Wt 133.0 lb

## 2012-05-21 DIAGNOSIS — E119 Type 2 diabetes mellitus without complications: Secondary | ICD-10-CM

## 2012-05-21 DIAGNOSIS — K5900 Constipation, unspecified: Secondary | ICD-10-CM

## 2012-05-21 DIAGNOSIS — R109 Unspecified abdominal pain: Secondary | ICD-10-CM

## 2012-05-21 DIAGNOSIS — R1084 Generalized abdominal pain: Secondary | ICD-10-CM

## 2012-05-21 DIAGNOSIS — Z23 Encounter for immunization: Secondary | ICD-10-CM

## 2012-05-21 NOTE — Addendum Note (Signed)
Addended by: Melchor Amour on: 05/21/2012 10:16 AM   Modules accepted: Orders

## 2012-05-21 NOTE — Progress Notes (Signed)
  Subjective:    Patient ID: Kelly Blake, female    DOB: [birth year], 71 y.o.   MRN: [id number]  HPI  Five-day history of abdominal pain. Symptoms off and on. The patient has diffuse bilateral lower abdomen. Occasional watery minimal loose stool past couple days. Preceded by almost one week of no stools and constipation. No bloody stools. When she has had stool past couple days very small volume. No nausea or vomiting. No fever. No dysuria. Pain 5/10 intensity at worst and actually slightly better today. Episodes of cramping usually last about 5 minutes. No appetite or weight changes. Colonoscopy 2008. History of diverticulitis but that pain was much different than that pain was more continuous.  Chest type 2 diabetes takes metformin. Blood sugars well-controlled. No history of abdominal symptoms on metformin.  Past Medical History  Diagnosis Date  . Arthritis   . Chicken pox   . Diverticulitis   . Allergy   . Hypertension   . Chronic kidney disease     stones  . Colon polyps    Past Surgical History  Procedure Date  . Knee arthroscopy 2003    torn meniscus  . Diverticulitis 2006    with abcess  . Dilation and curettage of uterus 2008    reports that she has never smoked. She does not have any smokeless tobacco history on file. She reports that she does not use illicit drugs. Her alcohol history not on file. family history includes Alzheimer's disease in her other; Cancer in her mother and other; Diabetes in her father and other; and Stroke in her other. No Known Allergies    Review of Systems  Constitutional: Negative for fever, chills, appetite change, fatigue and unexpected weight change.  HENT: Negative for trouble swallowing.   Respiratory: Negative for cough and shortness of breath.   Cardiovascular: Negative for chest pain.  Gastrointestinal: Positive for abdominal pain and constipation. Negative for nausea, vomiting, blood in stool and abdominal distention.    Genitourinary: Negative for dysuria.  Neurological: Negative for weakness.       Objective:   Physical Exam  Constitutional: She appears well-developed and well-nourished.  HENT:  Mouth/Throat: Oropharynx is clear and moist.  Cardiovascular: Normal rate and regular rhythm.   Pulmonary/Chest: Effort normal and breath sounds normal. No respiratory distress. She has no wheezes. She has no rales.  Abdominal: Soft. Bowel sounds are normal. She exhibits no distension and no mass. There is no tenderness. There is no rebound and no guarding.  Genitourinary:       No impaction. No rectal mass. Minimal brown stool.          Assessment & Plan:  Constipation. Patient has no evidence for impaction on exam. She'll try MiraLax. Increase fiber intake. Increase fluids. Regular exercise with walking.

## 2012-05-21 NOTE — Patient Instructions (Addendum)
Constipation in Adults Constipation is having fewer than 2 bowel movements per week. Usually, the stools are hard. As we grow older, constipation is more common. If you try to fix constipation with laxatives, the problem may get worse. This is because laxatives taken over a long period of time make the colon muscles weaker. A low-fiber diet, not taking in enough fluids, and taking some medicines may make these problems worse. MEDICATIONS THAT MAY CAUSE CONSTIPATION  Water pills (diuretics).   Calcium channel blockers (used to control blood pressure and for the heart).   Certain pain medicines (narcotics).   Anticholinergics.   Anti-inflammatory agents.   Antacids that contain aluminum.  DISEASES THAT CONTRIBUTE TO CONSTIPATION  Diabetes.   Parkinson's disease.   Dementia.   Stroke.   Depression.   Illnesses that cause problems with salt and water metabolism.  HOME CARE INSTRUCTIONS   Constipation is usually best cared for without medicines. Increasing dietary fiber and eating more fruits and vegetables is the best way to manage constipation.   Slowly increase fiber intake to 25 to 38 grams per day. Whole grains, fruits, vegetables, and legumes are good sources of fiber. A dietitian can further help you incorporate high-fiber foods into your diet.   Drink enough water and fluids to keep your urine clear or pale yellow.   A fiber supplement may be added to your diet if you cannot get enough fiber from foods.   Increasing your activities also helps improve regularity.   Suppositories, as suggested by your caregiver, will also help. If you are using antacids, such as aluminum or calcium containing products, it will be helpful to switch to products containing magnesium if your caregiver says it is okay.   If you have been given a liquid injection (enema) today, this is only a temporary measure. It should not be relied on for treatment of longstanding (chronic) constipation.    Stronger measures, such as magnesium sulfate, should be avoided if possible. This may cause uncontrollable diarrhea. Using magnesium sulfate may not allow you time to make it to the bathroom.  SEEK IMMEDIATE MEDICAL CARE IF:   There is bright red blood in the stool.   The constipation stays for more than 4 days.   There is belly (abdominal) or rectal pain.   You do not seem to be getting better.   You have any questions or concerns.  MAKE SURE YOU:   Understand these instructions.   Will watch your condition.   Will get help right away if you are not doing well or get worse.  Document Released: 05/18/2004 Document Revised: 08/09/2011 Document Reviewed: 07/24/2011 [facility] Patient Information 2012 [place], [place].  Consider Miralax once daily as needed and let me know if not seeing good BM with that over next couple of days.

## 2012-06-26 ENCOUNTER — Encounter: Payer: Self-pay | Admitting: Family Medicine

## 2012-08-06 ENCOUNTER — Ambulatory Visit (INDEPENDENT_AMBULATORY_CARE_PROVIDER_SITE_OTHER): Payer: Medicare Other | Admitting: Family Medicine

## 2012-08-06 ENCOUNTER — Encounter: Payer: Self-pay | Admitting: Family Medicine

## 2012-08-06 VITALS — BP 140/72 | Temp 98.0°F | Wt 132.0 lb

## 2012-08-06 DIAGNOSIS — E119 Type 2 diabetes mellitus without complications: Secondary | ICD-10-CM

## 2012-08-06 DIAGNOSIS — I10 Essential (primary) hypertension: Secondary | ICD-10-CM

## 2012-08-06 DIAGNOSIS — M25569 Pain in unspecified knee: Secondary | ICD-10-CM

## 2012-08-06 LAB — LIPID PANEL
HDL: 55.1 mg/dL (ref >39.00)
LDL Cholesterol: 135 mg/dL — ABNORMAL HIGH (ref 0–99)
Total CHOL/HDL Ratio: 4
VLDL: 10 mg/dL (ref 0.0–40.0)

## 2012-08-06 LAB — LDL CHOLESTEROL, DIRECT: Direct LDL: 134.6 mg/dL

## 2012-08-06 NOTE — Progress Notes (Signed)
Subjective:     Patient ID: Kelly Blake, female   DOB: July 25, 1941, 71 y.o.   MRN: [id number]  HPI Patient here for medical follow-up.  Current medical problems include type 2 diabetes and hypertension.  Stopped taking Lipitor 10mg  altogether about 3 months ago due to fatigue, which she feels has improved since stopping this medication.  Currently on metformin 500mg  qAM.  Last A1C late August was 6.2.  Patient states that she has committed to improving her diet and has made efforts to eliminate butter and saturated fats.  Also has been exercising 3 times/week at the [facility] taking a 1-hour exercise class with aerobic and hand weight exercises.  Shortly before stopping Lipitor, pt also stopped Benicar-HCT altogether.  She checks her BP at home and reports readings of 120-130/70-80.  Denies HA, visual disturbances, or dizziness.  Also complains of some intermittent knee pains.  Stiff in the morning, but improvement during the day with use.  Has not tried   Review of Systems  Respiratory: Negative for chest tightness and shortness of breath.   Cardiovascular: Negative for chest pain.  Musculoskeletal: Positive for arthralgias (knee pain).  Neurological: Negative for dizziness and headaches.       Objective:   Physical Exam  Constitutional: She appears well-developed and well-nourished. No distress.  Cardiovascular: Normal rate, regular rhythm and normal heart sounds.   Pulmonary/Chest: Effort normal and breath sounds normal. No respiratory distress.       Assessment:     71 year old here for medical follow-up.    Plan:     1. Type 2 diabetes: will re-check lipids today, and assess if pt needs to be re-started on a statin.  Per guidelines, LDL should be <100 in diabetics, and most recently she was 142 in Oct 2012 on Lipitor.  Given her intolerance of Lipitor, if LDL still high, pt said she would consider another statin such as pravastatin.  Continue exercise and diet habits.  Follow  up in 4 months and will re-check A1C at that time as well. 2. Hypertension: patient reporting stable readings at home, though slightly elevated today at 140/72.  No headaches, visual disturbance, or dizziness.  For now, continue to monitor.  If persistently elevated, will need to reconsider medication. 3. Knee pain: avoid chronic NSAIDs.  May try Tylenol, or if intolerable, topical patch.  Nonetheless, be sure to continue use and exercising regularly to avoid muscle wasting.  Follow up if pain persists or worsens.  Marthann Schiller, MS3     Agree with assessment and plan as per Marthann Schiller, MS 3 Suspect osteoarthritis right knee.  Significant crepitus with flexion and extension. [name] MD

## 2012-08-07 ENCOUNTER — Other Ambulatory Visit: Payer: Self-pay | Admitting: *Deleted

## 2012-08-07 DIAGNOSIS — E785 Hyperlipidemia, unspecified: Secondary | ICD-10-CM

## 2012-08-07 MED ORDER — PRAVASTATIN SODIUM 20 MG PO TABS: 20.0000 mg | ORAL_TABLET | Freq: Every day | ORAL | Status: DC

## 2012-08-07 NOTE — Progress Notes (Signed)
Quick Note:  Pt informed on personally identified VM ______ 

## 2012-08-08 ENCOUNTER — Telehealth: Payer: Self-pay | Admitting: Family Medicine

## 2012-08-08 NOTE — Telephone Encounter (Signed)
Error/kjh 

## 2012-10-09 ENCOUNTER — Other Ambulatory Visit (INDEPENDENT_AMBULATORY_CARE_PROVIDER_SITE_OTHER): Payer: Medicare Other

## 2012-10-09 DIAGNOSIS — E785 Hyperlipidemia, unspecified: Secondary | ICD-10-CM

## 2012-10-09 LAB — HEPATIC FUNCTION PANEL
ALT: 22 U/L (ref 0–35)
AST: 27 U/L (ref 0–37)
Alkaline Phosphatase: 55 U/L (ref 39–117)
Bilirubin, Direct: 0.1 mg/dL (ref 0.0–0.3)
Total Bilirubin: 0.7 mg/dL (ref 0.3–1.2)

## 2012-10-09 LAB — LIPID PANEL: HDL: 38 mg/dL — ABNORMAL LOW (ref >39.00)

## 2012-10-15 NOTE — Progress Notes (Signed)
Quick Note:  Pt informed on personally identified VM ______ 

## 2012-10-24 ENCOUNTER — Telehealth: Payer: Self-pay | Admitting: Family Medicine

## 2012-10-24 NOTE — Telephone Encounter (Signed)
Pt needs new script for pravastatin (PRAVACHOL) 20 MG tablet.   This is going to new pharm: [facility]. [phone] (Went to [facility] the initial order).   Pt would like a copy of last blood work done 10/09/12. Thanks.

## 2012-10-27 MED ORDER — PRAVASTATIN SODIUM 20 MG PO TABS: 20.0000 mg | ORAL_TABLET | Freq: Every day | ORAL | Status: DC

## 2012-10-27 NOTE — Telephone Encounter (Signed)
Labs mailed

## 2012-12-24 ENCOUNTER — Ambulatory Visit (INDEPENDENT_AMBULATORY_CARE_PROVIDER_SITE_OTHER): Payer: Medicare Other | Admitting: Family Medicine

## 2012-12-24 ENCOUNTER — Encounter: Payer: Self-pay | Admitting: Family Medicine

## 2012-12-24 VITALS — BP 150/80 | Temp 98.3°F | Wt 132.0 lb

## 2012-12-24 DIAGNOSIS — E119 Type 2 diabetes mellitus without complications: Secondary | ICD-10-CM

## 2012-12-24 DIAGNOSIS — E785 Hyperlipidemia, unspecified: Secondary | ICD-10-CM

## 2012-12-24 DIAGNOSIS — I10 Essential (primary) hypertension: Secondary | ICD-10-CM

## 2012-12-24 LAB — HEMOGLOBIN A1C: Hgb A1c MFr Bld: 6.7 % — ABNORMAL HIGH (ref 4.6–6.5)

## 2012-12-24 NOTE — Progress Notes (Signed)
  Subjective:    Patient ID: Kelly Blake, female    DOB: Jan 27, 1941, 72 y.o.   MRN: [id number]  HPI Patient seen for follow up Type 2 diabetes. History of good control. Exercises regularly. Does not monitor blood sugars. No symptoms of hypo-or hyperglycemia. Only takes metformin for diabetes.  Hyperlipidemia treated with pravastatin 20 mg daily. Tolerating well with no side effects. No recent chest pains. Recent LDL 102.  Patient has history of borderline elevated blood pressure. Most blood pressures have been ranging 130/80 range. Previously took ARB.  Currently not taking any blood pressure medications.  Past Medical History  Diagnosis Date  . Arthritis   . Chicken pox   . Diverticulitis   . Allergy   . Hypertension   . Chronic kidney disease     stones  . Colon polyps    Past Surgical History  Procedure Laterality Date  . Knee arthroscopy  2003    torn meniscus  . Diverticulitis  2006    with abcess  . Dilation and curettage of uterus  2008    reports that she has never smoked. She does not have any smokeless tobacco history on file. She reports that she does not use illicit drugs. Her alcohol history is not on file. family history includes Alzheimer's disease in her other; Cancer in her mother and other; Diabetes in her father and other; and Stroke in her other. No Known Allergies    Review of Systems  Constitutional: Negative for fatigue.  Eyes: Negative for visual disturbance.  Respiratory: Negative for cough, chest tightness, shortness of breath and wheezing.   Cardiovascular: Negative for chest pain, palpitations and leg swelling.  Neurological: Negative for dizziness, seizures, syncope, weakness, light-headedness and headaches.       Objective:   Physical Exam  Constitutional: She appears well-developed and well-nourished. No distress.  Neck: Neck supple. No thyromegaly present.  Cardiovascular: Normal rate and regular rhythm.  Exam reveals no gallop.    Pulmonary/Chest: Effort normal and breath sounds normal. No respiratory distress. She has no wheezes. She has no rales.  Musculoskeletal: She exhibits no edema.          Assessment & Plan:  #1 type 2 diabetes. Continue yearly eye exams. Check A1c. Check urine microalbumin creatinine ratio. #2 mildly elevated blood pressure. Monitor at home. If consistently greater than 130/80 consider ACE inhibitor. #3 hyperlipidemia. Controlled by recent labs. Continue pravastatin

## 2012-12-24 NOTE — Patient Instructions (Signed)
Blood pressure goal is < 130/85 Be in touch if consistently over this level.

## 2013-04-04 ENCOUNTER — Other Ambulatory Visit: Payer: Self-pay | Admitting: Family Medicine

## 2013-04-08 ENCOUNTER — Other Ambulatory Visit: Payer: Self-pay

## 2013-05-20 ENCOUNTER — Telehealth: Payer: Self-pay | Admitting: Family Medicine

## 2013-05-20 NOTE — Telephone Encounter (Signed)
Patient Information:  Caller Name: [place]  Phone: [phone]  Patient: Kelly Blake, Kelly Blake  Gender: Female  DOB: Aug 15, 1941  Age: 72 Years  PCP: [name] [facility])  Office Follow Up:  Does the office need to follow up with this patient?: Yes  Instructions For The Office: Please call back ASAP regarding work in appointment 05/20/13.  RN Note:  Does not check blood sugars since labs have been normal. Drinking fluids and voiding; last void at 14:55. Feels that abdominal cramping is improving but still has not had a BM.  History of diverticulosis. No appointments remain at [place], [place] [facility] or [place] office.  Please call back regarding work in appointment.  Lives 10-15 minutes from the office.   Symptoms  Reason For Call & Symptoms: Moderate intermittent lower abdominal cramps with fever up to 101 05/19/13 and nausea.  Able to strain hard allowing her to pass as small amount of diarrhea and gas with relief of nausea and stomach cramps.  Last normal stool 05/18/13.  No vomiting.  Reviewed Health History In EMR: Yes  Reviewed Medications In EMR: Yes  Reviewed Allergies In EMR: Yes  Reviewed Surgeries / Procedures: Yes  Date of Onset of Symptoms: 05/18/2013  Any Fever: Yes  Fever Taken: Oral  Fever Time Of Reading: 10:00:00  Fever Last Reading: 99.2  Guideline(s) Used:  Abdominal Pain - Female  Disposition Per Guideline:   See Today in Office  Reason For Disposition Reached:   Moderate or mild pain that comes and goes (cramps) lasts > 24 hours  Advice Given:  Reassurance:  A mild stomachache can be caused by indigestion, gas pains or overeating. Sometimes a stomachache signals the onset of a vomiting illness due to a viral gastroenteritis ("stomach flu").  Rest:  Lie down and rest until you feel better.  Fluids:  Sip clear fluids only (e.g., water, flat soft drinks or 1/2 strength fruit juice) until the pain has been gone for over 2 hours.  Then slowly return to a regular diet.  Diet:  Slowly advance diet from clear liquids to a bland diet  Avoid alcohol or caffeinated beverages  Avoid greasy or fatty foods.  Pass a BM:  Sit on the toilet and try to pass a bowel movement (BM). Do not strain. This may relieve the pain if it is due to constipation or impending diarrhea.  Avoid NSAIDS and Aspirin  : Avoid any drug that can irritate the stomach lining and make the pain worse (especially aspirin and NSAIDs like ibuprofen).  Expected Course:  With harmless causes, the pain is usually better or goes away within 2 hours. With viral gastroenteritis ("stomach flu"), belly cramps may precede each bout of vomiting or diarrhea and may last 2-3 days. With serious causes (such as appendicitis) the pain becomes constant and more severe.  Call Back If:  Abdominal pain is constant and present for more than 2 hours  Abdominal pains come and go and are present for more than 24 hours  You become worse.  Patient Will Follow Care Advice:  YES

## 2013-05-20 NOTE — Telephone Encounter (Signed)
Per dr  Caryl Never- givem cipro 500 tid for 10 days and flagyl 500 bid for 10 days and ov in AM.  Talked with pt and refused meds stating she got c diff from cipro. SHE.request ov in am. Ov given and pt instructed to go to ed if sx worsens or becomes more febrile.

## 2013-05-21 ENCOUNTER — Encounter: Payer: Self-pay | Admitting: Family Medicine

## 2013-05-21 ENCOUNTER — Ambulatory Visit (INDEPENDENT_AMBULATORY_CARE_PROVIDER_SITE_OTHER): Payer: Medicare Other | Admitting: Family Medicine

## 2013-05-21 ENCOUNTER — Telehealth: Payer: Self-pay | Admitting: Family Medicine

## 2013-05-21 VITALS — BP 130/70 | HR 95 | Temp 98.5°F | Wt 130.0 lb

## 2013-05-21 DIAGNOSIS — R1031 Right lower quadrant pain: Secondary | ICD-10-CM

## 2013-05-21 DIAGNOSIS — IMO0001 Reserved for inherently not codable concepts without codable children: Secondary | ICD-10-CM

## 2013-05-21 DIAGNOSIS — R1032 Left lower quadrant pain: Secondary | ICD-10-CM

## 2013-05-21 LAB — POCT URINALYSIS DIPSTICK
Spec Grav, UA: >=1.03
pH, UA: 5.5

## 2013-05-21 LAB — CBC WITH DIFFERENTIAL/PLATELET
Basophils Relative: 0.6 % (ref 0.0–3.0)
Eosinophils Relative: 0.1 % (ref 0.0–5.0)
HCT: 37 % (ref 36.0–46.0)
Hemoglobin: 12.6 g/dL (ref 12.0–15.0)
Lymphs Abs: 0.5 10*3/uL — ABNORMAL LOW (ref 0.7–4.0)
MCV: 92.2 fl (ref 78.0–100.0)
Monocytes Absolute: 0.5 10*3/uL (ref 0.1–1.0)
Neutro Abs: 3.4 10*3/uL (ref 1.4–7.7)
RBC: 4.01 Mil/uL (ref 3.87–5.11)
WBC: 4.4 10*3/uL — ABNORMAL LOW (ref 4.5–10.5)

## 2013-05-21 MED ORDER — CIPROFLOXACIN HCL 500 MG PO TABS: 500.0000 mg | ORAL_TABLET | Freq: Two times a day (BID) | ORAL | Status: DC

## 2013-05-21 MED ORDER — METRONIDAZOLE 500 MG PO TABS: 500.0000 mg | ORAL_TABLET | Freq: Three times a day (TID) | ORAL | Status: DC

## 2013-05-21 NOTE — Patient Instructions (Addendum)
Diverticulitis °A diverticulum is a small pouch or sac on the colon. Diverticulosis is the presence of these diverticula on the colon. Diverticulitis is the irritation (inflammation) or infection of diverticula. °CAUSES  °The colon and its diverticula contain bacteria. If food particles block the tiny opening to a diverticulum, the bacteria inside can grow and cause an increase in pressure. This leads to infection and inflammation and is called diverticulitis. °SYMPTOMS  °· Abdominal pain and tenderness. Usually, the pain is located on the left side of your abdomen. However, it could be located elsewhere. °· Fever. °· Bloating. °· Feeling sick to your stomach (nausea). °· Throwing up (vomiting). °· Abnormal stools. °DIAGNOSIS  °Your caregiver will take a history and perform a physical exam. Since many things can cause abdominal pain, other tests may be necessary. Tests may include: °· Blood tests. °· Urine tests. °· X-ray of the abdomen. °· CT scan of the abdomen. °Sometimes, surgery is needed to determine if diverticulitis or other conditions are causing your symptoms. °TREATMENT  °Most of the time, you can be treated without surgery. Treatment includes: °· Resting the bowels by only having liquids for a few days. As you improve, you will need to eat a low-fiber diet. °· Intravenous (IV) fluids if you are losing body fluids (dehydrated). °· Antibiotic medicines that treat infections may be given. °· Pain and nausea medicine, if needed. °· Surgery if the inflamed diverticulum has burst. °HOME CARE INSTRUCTIONS  °· Try a clear liquid diet (broth, tea, or water for as long as directed by your caregiver). You may then gradually begin a low-fiber diet as tolerated.  °A low-fiber diet is a diet with less than 10 grams of fiber. Choose the foods below to reduce fiber in the diet: °· White breads, cereals, rice, and pasta. °· Cooked fruits and vegetables or soft fresh fruits and vegetables without the skin. °· Ground or  well-cooked tender beef, ham, veal, lamb, pork, or poultry. °· Eggs and seafood. °· After your diverticulitis symptoms have improved, your caregiver may put you on a high-fiber diet. A high-fiber diet includes 14 grams of fiber for every 1000 calories consumed. For a standard 2000 calorie diet, you would need 28 grams of fiber. Follow these diet guidelines to help you increase the fiber in your diet. It is important to slowly increase the amount fiber in your diet to avoid gas, constipation, and bloating. °· Choose whole-grain breads, cereals, pasta, and brown rice. °· Choose fresh fruits and vegetables with the skin on. Do not overcook vegetables because the more vegetables are cooked, the more fiber is lost. °· Choose more nuts, seeds, legumes, dried peas, beans, and lentils. °· Look for food products that have greater than 3 grams of fiber per serving on the Nutrition Facts label. °· Take all medicine as directed by your caregiver. °· If your caregiver has given you a follow-up appointment, it is very important that you go. Not going could result in lasting (chronic) or permanent injury, pain, and disability. If there is any problem keeping the appointment, call to reschedule. °SEEK MEDICAL CARE IF:  °· Your pain does not improve. °· You have a hard time advancing your diet beyond clear liquids. °· Your bowel movements do not return to normal. °SEEK IMMEDIATE MEDICAL CARE IF:  °· Your pain becomes worse. °· You have an oral temperature above 102° F (38.9° C), not controlled by medicine. °· You have repeated vomiting. °· You have bloody or black, tarry stools. °·   Symptoms that brought you to your caregiver become worse or are not getting better. °MAKE SURE YOU:  °· Understand these instructions. °· Will watch your condition. °· Will get help right away if you are not doing well or get worse. °Document Released: 05/30/2005 Document Revised: 11/12/2011 Document Reviewed: 09/25/2010 °[employer]® Patient Information  ©2014 [employer]. ° °

## 2013-05-21 NOTE — Progress Notes (Signed)
  Subjective:    Patient ID: Kelly Blake, female    DOB: Jan 03, 1941, 72 y.o.   MRN: [id number]  HPI Patient seen as a work in on with abdominal pain. Onset this past Monday. She describes this bilateral cramps mid to lower and left quadrant. She has past history of diverticulitis flare several years ago. She's had some nausea but no vomiting. She had low-grade fever. Highest temperature 100.1 on Tuesday and low grade since then. She had normal bowel movement couple days ago but none since then. No diarrhea. She has had some decreased appetite. She has type 2 diabetes which has been fairly well controlled.  Past Medical History  Diagnosis Date  . Arthritis   . Chicken pox   . Diverticulitis   . Allergy   . Hypertension   . Chronic kidney disease     stones  . Colon polyps    Past Surgical History  Procedure Laterality Date  . Knee arthroscopy  2003    torn meniscus  . Diverticulitis  2006    with abcess  . Dilation and curettage of uterus  2008    reports that she has never smoked. She does not have any smokeless tobacco history on file. She reports that she does not use illicit drugs. Her alcohol history is not on file. family history includes Alzheimer's disease in her other; Cancer in her mother and other; Diabetes in her father and other; Stroke in her other. No Known Allergies    Review of Systems  Constitutional: Negative for fever and chills.  Respiratory: Negative for cough and shortness of breath.   Cardiovascular: Negative for chest pain.  Gastrointestinal: Positive for nausea and abdominal pain. Negative for vomiting, diarrhea and abdominal distention.  Genitourinary: Negative for dysuria and hematuria.  Skin: Negative for rash.  Neurological: Negative for dizziness.       Objective:   Physical Exam  Constitutional: She is oriented to person, place, and time. She appears well-developed and well-nourished.  HENT:  Mouth/Throat: Oropharynx is clear and  moist.  Cardiovascular: Normal rate and regular rhythm.   Pulmonary/Chest: Effort normal and breath sounds normal. No respiratory distress. She has no wheezes. She has no rales.  Abdominal: Soft. Bowel sounds are normal. She exhibits no distension and no mass. There is tenderness. There is no rebound and no guarding.  Mild tenderness left lower quadrant and mid abdomen  Musculoskeletal: She exhibits no edema.  Neurological: She is alert and oriented to person, place, and time.  Skin: No rash noted.          Assessment & Plan:  Bilateral lower abdominal pain. Question diverticulitis flare. No acute abdomen. Check urinalysis and CBC. Start Cipro 500 mg twice a day for 10 days. Flagyl 500 mg 3 times a day for 10 days

## 2013-05-21 NOTE — Telephone Encounter (Signed)
[facility] Triage Call Report Triage Record Num: [id number] Operator: Ether Griffins Patient Name: Kelly Blake Call Date & Time: 05/20/2013 4:57:55PM Patient Phone: [phone] PCP: Patient Gender: Female PCP Fax : Patient DOB: 28-Mar-1941 [facility] Name: [facility] Reason for Call: Caller: Dollene Primrose; PCP: [name] (Family Practice); CB#: [phone]; Spoke to nurse from the office earlier today and was supposed to get a call back with an appt. Calling about fever,abd cramps,nausea,sm amt diarrhea,poor appetite,not much energy. Onset 05/18/13. Temp 99.8(PO). Last BM 05/18/13. Pulled chart in EPIC and appt is scheduled for 05/21/13 @ 0945 with Dr Caryl Never. Pt also got a call from the office regarding appt. Protocol(s) Used: PCP Calls, No Triage (Adult) Recommended Outcome per Protocol: Call Provider within 24 Hours Reason for Outcome: [1] Follow-up call from parent regarding patient's clinical status AND [2] information not urgent Care Advice: ~ 09/

## 2013-06-02 ENCOUNTER — Telehealth: Payer: Self-pay | Admitting: Family Medicine

## 2013-06-02 NOTE — Telephone Encounter (Signed)
Patient declines to go to ER.  Appointment made with Dr Caryl Never.

## 2013-06-02 NOTE — Telephone Encounter (Signed)
Patient Information:  Caller Name: Kelly Blake  Phone: [phone]  Patient: Kelly, Blake  Gender: Female  DOB: 08-May-1941  Age: 72 Years  PCP: Kelly Blake [facility])  Office Follow Up:  Does the office need to follow up with this patient?: Yes  Instructions For The Office: Call Kelly Blake at [phone].  Has had black stools every day since taking  10 days of Cipro and Flagyl for diverticulitis. Was seen in office on 9/18 and completed meds on 9/27. Black stools continues since meds completed. Has ED  or go to office disposition per rectal bleeding protocol. No appt available in EPIC.   Symptoms  Reason For Call & Symptoms: Kelly Blake states she was seen in office on 9/18 and diagnosed with Diverticulitis. Completed 10 days of  Flagy and Cipro on 05/30/13. Has had  1 to 3 dark black stools daily since started Cipro and Flagyl. Black stools have continued since finishing antibiotics on 05/29/13.  Some black stools have small amt brown color and some brown stools. Asking if continuing to have black stools is normal. Asking if she needs F/U appoinment to office visit on 05/21/13. Per rectal bleeding protocol has go to ED now or office with PCP approval. No appt available in EPIC.  Please call Kelly Blake-  aware that callback will be from another provider- Dr. Caryl Blake not in office this afternoon.  Reviewed Health History In EMR: Yes  Reviewed Medications In EMR: Yes  Reviewed Allergies In EMR: Yes  Reviewed Surgeries / Procedures: Yes  Date of Onset of Symptoms: 05/18/2013  Treatments Tried: Flagyl, Cipro  Treatments Tried Worked: No  Guideline(s) Used:  Rectal Bleeding  Disposition Per Guideline:   Go to ED Now (or to Office with PCP Approval)  Reason For Disposition Reached:   Bloody, black, or tarry bowel movements  Advice Given:  N/A  Patient Will Follow Care Advice:  YES

## 2013-06-03 ENCOUNTER — Ambulatory Visit (INDEPENDENT_AMBULATORY_CARE_PROVIDER_SITE_OTHER): Payer: Medicare Other | Admitting: Family Medicine

## 2013-06-03 ENCOUNTER — Encounter: Payer: Self-pay | Admitting: Family Medicine

## 2013-06-03 VITALS — BP 128/80 | HR 78 | Temp 98.1°F | Wt 132.0 lb

## 2013-06-03 DIAGNOSIS — R195 Other fecal abnormalities: Secondary | ICD-10-CM

## 2013-06-03 DIAGNOSIS — Z23 Encounter for immunization: Secondary | ICD-10-CM

## 2013-06-03 NOTE — Progress Notes (Signed)
  Subjective:    Patient ID: Kelly Blake, female    DOB: February 05, 1941, 72 y.o.   MRN: [id number]  HPI Patient had recent diverticulitis flare. Improved following Flagyl and Cipro. She is here today with some dark appearing stools. No clear melena. By this morning her stools were back to dark brown. No dizziness. No abdominal pain. No further fevers or chills. No recent Pepto-Bismol use. She thinks antibiotics might have discolored her stools. No diarrhea  Past Medical History  Diagnosis Date  . Arthritis   . Chicken pox   . Diverticulitis   . Allergy   . Hypertension   . Chronic kidney disease     stones  . Colon polyps    Past Surgical History  Procedure Laterality Date  . Knee arthroscopy  2003    torn meniscus  . Diverticulitis  2006    with abcess  . Dilation and curettage of uterus  2008    reports that she has never smoked. She does not have any smokeless tobacco history on file. She reports that she does not use illicit drugs. Her alcohol history is not on file. family history includes Alzheimer's disease in her other; Cancer in her mother and other; Diabetes in her father and other; Stroke in her other. No Known Allergies    Review of Systems  Constitutional: Negative for fever and chills.  Gastrointestinal: Negative for abdominal pain.       Objective:   Physical Exam  Constitutional: She appears well-developed and well-nourished.  Cardiovascular: Normal rate and regular rhythm.   Pulmonary/Chest: Effort normal and breath sounds normal.  Abdominal: Soft. Bowel sounds are normal. She exhibits no distension. There is no tenderness. There is no rebound and no guarding.  Genitourinary:  Rectal exam minimal brown stool heme-negative          Assessment & Plan:  Abnormal stool color-earlier in week but back to normal appearance now. No evidence for melanoma. Hemoccult negative. Reassurance.

## 2013-06-05 ENCOUNTER — Ambulatory Visit: Payer: Medicare Other

## 2013-10-23 ENCOUNTER — Other Ambulatory Visit: Payer: Self-pay | Admitting: Family Medicine

## 2013-12-10 ENCOUNTER — Other Ambulatory Visit: Payer: Self-pay

## 2014-04-02 ENCOUNTER — Encounter: Payer: Self-pay | Admitting: Family Medicine

## 2014-04-02 ENCOUNTER — Ambulatory Visit (INDEPENDENT_AMBULATORY_CARE_PROVIDER_SITE_OTHER): Payer: Medicare Other | Admitting: Family Medicine

## 2014-04-02 VITALS — BP 142/78 | HR 80 | Temp 98.2°F | Resp 18 | Ht 60.5 in | Wt 135.0 lb

## 2014-04-02 DIAGNOSIS — S66912A Strain of unspecified muscle, fascia and tendon at wrist and hand level, left hand, initial encounter: Secondary | ICD-10-CM

## 2014-04-02 DIAGNOSIS — S63509A Unspecified sprain of unspecified wrist, initial encounter: Secondary | ICD-10-CM

## 2014-04-02 NOTE — Patient Instructions (Signed)
Consider icing left wrist for 20-30 minutes 2 times daily Consider wrist splint for next couple of weeks

## 2014-04-02 NOTE — Progress Notes (Signed)
Pre visit review using our clinic review tool, if applicable. No additional management support is needed unless otherwise documented below in the visit note. 

## 2014-04-02 NOTE — Progress Notes (Signed)
   Subjective:    Patient ID: Kelly Blake, female    DOB: 1941/06/08, 73 y.o.   MRN: [id number]  Wrist Injury  Pertinent negatives include no numbness.   Acute visit for left wrist pain. Fell 3 days ago she rolled over in bed and was reaching over to scratch her husband's back and felt some pain along older lateral aspect of the wrist. She did not hear any pop. She's had some very mild swelling and pain since then along the ulnar region. No bruising. She has not taken any medications. Has not tried any ice.  Pain exacerbated by movement such as lifting  Past Medical History  Diagnosis Date  . Arthritis   . Chicken pox   . Diverticulitis   . Allergy   . Hypertension   . Chronic kidney disease     stones  . Colon polyps    Past Surgical History  Procedure Laterality Date  . Knee arthroscopy  2003    torn meniscus  . Diverticulitis  2006    with abcess  . Dilation and curettage of uterus  2008    reports that she has never smoked. She does not have any smokeless tobacco history on file. She reports that she does not use illicit drugs. Her alcohol history is not on file. family history includes Alzheimer's disease in her other; Cancer in her mother and other; Diabetes in her father and other; Stroke in her other. No Known Allergies    Review of Systems  Neurological: Negative for weakness and numbness.       Objective:   Physical Exam  Constitutional: She appears well-developed and well-nourished.  Cardiovascular: Normal rate and regular rhythm.   Pulmonary/Chest: Effort normal and breath sounds normal. No respiratory distress. She has no wheezes. She has no rales.  Musculoskeletal:  Left wrist reveals full range of motion. No erythema. No warmth. She has some tenderness on the lateral aspect of the wrist minimally          Assessment & Plan:  Left wrist strain. Suspect ligament strain.  Recommend icing 20 to 30 minutes 2-3 times daily for the next few days.  Consider short-term use of wrist splint. Followup if not improving over the next few weeks

## 2014-04-12 ENCOUNTER — Telehealth: Payer: Self-pay | Admitting: Family Medicine

## 2014-04-12 NOTE — Telephone Encounter (Signed)
Patient Information:  Caller Name: Randa EvensJoanne  Phone: [phone]  Patient: Kelly Blake, Kelly Blake  Gender: Female  DOB: 01-Mar-1941  Age: [age] Years  PCP: Evelena PeatBurchette, Bruce Parkside(Family Practice)  Office Follow Up:  Does the office need to follow up with this patient?: No  Instructions For The Office: N/A   Symptoms  Reason For Call & Symptoms: 04/11/14 sting on left thumb, possibly yellow jacket.   04/12/14 hand, fingers and wrist swollen, pain is better, itching is better.    Took Ibuprofen, Benadryl, applied ice.  Reviewed Health History In EMR: Yes  Reviewed Medications In EMR: Yes  Reviewed Allergies In EMR: Yes  Reviewed Surgeries / Procedures: Yes  Date of Onset of Symptoms: 04/11/2014  Treatments Tried: Ibuprofen, Benadryl, Ice  Treatments Tried Worked: No  Guideline(s) Used:  Warden/rangerBee Sting  Disposition Per Guideline:   Home Care  Reason For Disposition Reached:   Normal local reaction to bee, wasp, or yellow jacket sting  Advice Given:  Try to Remove the Stinger (if present):  In many cases no stinger will be present. Only bees leave their stingers. Wasps, yellow jackets, and hornets do not.  Apply Cold to the Area for Pain - Cold Pack Method:  Wrap a bag of ice in a towel (or use a bag of frozen vegetables such as peas).  Apply this cold pack to the area of the sting for 10-20 minutes.  You may repeat this as needed, to relieve symptoms of pain and swelling.  Pain Medicines:  For pain relief, you can take either acetaminophen, ibuprofen, or naproxen.  Hydrocortisone Cream for Itching:  Hydrocortisone cream applied to the sting area 4 times a day can also help reduce itching. Use it for a couple days until the itch is mild.  Antihistamine Medication for Itching:  If the sting becomes very itchy, you can take diphenhydramine (e.g., Benadryl). The adult dosage 25-50 mg by mouth every 6 hours on an as needed basis.  Loratadine is a newer (second generation) antihistamine. The dosage of  loratadine (e.g., OTC Claritin, Alavert) is 10 mg once a day.  Cetirizine is a newer (second generation) antihistamine. The dosage of cetirizine (e.g., OTC Zyrtec) is 10 mg once a day.  Loratadine and cetirizine cause less sleepiness than diphenhydramine (Benadryl) or Chlorpheniramine (Chlortrimeton, Chlor-tripolon).  Expected Course:  Pain: Severe pain or burning at the site lasts 1 to 2 hours. Pain after this period is usually minimal. Itching often follows the pain.  Redness and Swelling: Normal redness and swelling from the venom can increase for 24 hours following the sting. Redness at the sting site is normal. It doesn't mean that it is infected. The redness can last 3 days and the swelling 7 days.  Stings only rarely get infected.  Call Back If:  Swelling becomes huge  Sting begins to look infected  You become worse.  Patient Will Follow Care Advice:  YES

## 2014-04-12 NOTE — Telephone Encounter (Signed)
FYI

## 2014-04-12 NOTE — Telephone Encounter (Signed)
Pt stated that she was going to follow the instructions by Call a Nurse. Pt stated that she doesn't think she need to be seen right now, the itching has stopped and she is feeling better. Informed pt that if the swelling doenst go down that she can call back to office for appointment.

## 2014-04-26 ENCOUNTER — Other Ambulatory Visit: Payer: Self-pay | Admitting: Family Medicine

## 2014-06-04 ENCOUNTER — Ambulatory Visit (INDEPENDENT_AMBULATORY_CARE_PROVIDER_SITE_OTHER): Payer: Medicare Other | Admitting: Family Medicine

## 2014-06-04 DIAGNOSIS — Z23 Encounter for immunization: Secondary | ICD-10-CM

## 2014-07-14 ENCOUNTER — Other Ambulatory Visit: Payer: Self-pay | Admitting: Radiology

## 2014-07-28 ENCOUNTER — Encounter: Payer: Self-pay | Admitting: Obstetrics and Gynecology

## 2014-08-26 ENCOUNTER — Encounter: Payer: Self-pay | Admitting: Family Medicine

## 2014-08-26 ENCOUNTER — Ambulatory Visit (INDEPENDENT_AMBULATORY_CARE_PROVIDER_SITE_OTHER): Payer: Medicare Other | Admitting: Family Medicine

## 2014-08-26 VITALS — BP 130/70 | HR 83 | Temp 98.0°F | Wt 131.0 lb

## 2014-08-26 DIAGNOSIS — J209 Acute bronchitis, unspecified: Secondary | ICD-10-CM

## 2014-08-26 MED ORDER — HYDROCODONE-HOMATROPINE 5-1.5 MG/5ML PO SYRP: 5.0000 mL | ORAL_SOLUTION | Freq: Four times a day (QID) | ORAL | Status: AC | PRN

## 2014-08-26 NOTE — Progress Notes (Signed)
   Subjective:    Patient ID: Kelly Blake, female    DOB: Nov 23, 1940, 73 y.o.   MRN: [id number]  HPI   Acute visit for cough. Onset about 2 weeks ago. She's had some nasal congestion. Does not feel particularly ill. Husband had similar symptoms. Cough has been nonproductive. Denies any headaches or body aches. No fevers or chills. No nausea or vomiting. No dyspnea. Nonsmoker. Over-the-counter cough medication without much relief. Cough actually seems to be improving past few days  Past Medical History  Diagnosis Date  . Arthritis   . Chicken pox   . Diverticulitis   . Allergy   . Hypertension   . Chronic kidney disease     stones  . Colon polyps    Past Surgical History  Procedure Laterality Date  . Knee arthroscopy  2003    torn meniscus  . Diverticulitis  2006    with abcess  . Dilation and curettage of uterus  2008    reports that she has never smoked. She does not have any smokeless tobacco history on file. She reports that she does not use illicit drugs. Her alcohol history is not on file. family history includes Alzheimer's disease in her other; Cancer in her mother and other; Diabetes in her father and other; Stroke in her other. No Known Allergies    Review of Systems  Constitutional: Negative for fever and chills.  HENT: Positive for congestion.   Respiratory: Positive for cough. Negative for shortness of breath and wheezing.        Objective:   Physical Exam  Constitutional: She appears well-developed and well-nourished.  HENT:  Right Ear: External ear normal.  Left Ear: External ear normal.  Mouth/Throat: Oropharynx is clear and moist.  Neck: Neck supple.  Cardiovascular: Normal rate and regular rhythm.   Pulmonary/Chest: Effort normal and breath sounds normal. No respiratory distress. She has no wheezes. She has no rales.  Lymphadenopathy:    She has no cervical adenopathy.          Assessment & Plan:  Cough. Suspect acute viral bronchitis.  No antibiotics indicated at this time. Hycodan cough 1 teaspoon daily at bedtime when necessary severe cough. Follow-up promptly for fever or worsening symptoms

## 2014-08-26 NOTE — Patient Instructions (Signed)

## 2014-08-26 NOTE — Progress Notes (Signed)
Pre visit review using our clinic review tool, if applicable. No additional management support is needed unless otherwise documented below in the visit note. 

## 2014-10-06 ENCOUNTER — Ambulatory Visit (INDEPENDENT_AMBULATORY_CARE_PROVIDER_SITE_OTHER): Payer: Medicare Other | Admitting: Family Medicine

## 2014-10-06 ENCOUNTER — Encounter: Payer: Self-pay | Admitting: Family Medicine

## 2014-10-06 VITALS — BP 130/70 | HR 90 | Temp 98.0°F | Wt 130.0 lb

## 2014-10-06 DIAGNOSIS — R1032 Left lower quadrant pain: Secondary | ICD-10-CM

## 2014-10-06 MED ORDER — METRONIDAZOLE 500 MG PO TABS
500.0000 mg | ORAL_TABLET | Freq: Two times a day (BID) | ORAL | Status: DC
Start: 2014-10-06 — End: 2014-12-22

## 2014-10-06 MED ORDER — CIPROFLOXACIN HCL 500 MG PO TABS: 500.0000 mg | ORAL_TABLET | Freq: Two times a day (BID) | ORAL | Status: DC

## 2014-10-06 NOTE — Progress Notes (Signed)
Pre visit review using our clinic review tool, if applicable. No additional management support is needed unless otherwise documented below in the visit note. 

## 2014-10-06 NOTE — Progress Notes (Signed)
   Subjective:    Patient ID: Kelly Blake, female    DOB: [birth year], 74 y.o.   MRN: [id number]  HPI   Patient seen for abdominal pain. Her pain is mostly left lower quadrant and somewhat mid lower abdomen. [place] She describes a cramp-like pain which is intermittent. No dysuria. Possible low-grade fever off-and-on. No change in bowel habits. Very similar symptoms in the past with acute diverticulitis. She had acute diverticulitis confirmed on CAT scan 2007. [facility] and was treated with Septra DS and pain is slightly better but far from resolved. She's had slight decrease in appetite. No recent weight changes. No bloody stools. No exacerbating factors and no alleviating factors. Pain is relatively mild.  Past Medical History  Diagnosis Date  . Arthritis   . Chicken pox   . Diverticulitis   . Allergy   . Hypertension   . Chronic kidney disease     stones  . Colon polyps    Past Surgical History  Procedure Laterality Date  . Knee arthroscopy  2003    torn meniscus  . Diverticulitis  2006    with abcess  . Dilation and curettage of uterus  2008    reports that she has never smoked. She does not have any smokeless tobacco history on file. She reports that she does not use illicit drugs. Her alcohol history is not on file. family history includes Alzheimer's disease in her other; Cancer in her mother and other; Diabetes in her father and other; Stroke in her other. No Known Allergies    Review of Systems  Constitutional: Negative for fever and chills.  Respiratory: Negative for cough and shortness of breath.   Cardiovascular: Negative for chest pain.  Gastrointestinal: Positive for abdominal pain. Negative for nausea, vomiting, diarrhea, constipation, blood in stool and abdominal distention.  Neurological: Negative for dizziness.       Objective:   Physical Exam  Constitutional: She appears  well-developed and well-nourished.  HENT:  Mouth/Throat: Oropharynx is clear and moist.  Cardiovascular: Normal rate and regular rhythm.   Pulmonary/Chest: Effort normal and breath sounds normal. No respiratory distress. She has no wheezes. She has no rales.  Abdominal: Soft. Bowel sounds are normal. She exhibits no distension and no mass. There is no rebound and no guarding.  Minimally tender left lower quadrant to deep palpation          Assessment & Plan:  Left lower quadrant abdominal pain. Suspect acute diverticulitis. Only partial response with Septra DS which was prescribed elsewhere. Discontinue Septra DS and start Cipro 500 mg twice a day and Flagyl 500 mg twice a day for 10 days. Consider CT abdomen and pelvis if not resolving after that. Follow-up immediately for any fever or worsening symptoms.

## 2014-10-06 NOTE — Patient Instructions (Signed)

## 2014-10-21 ENCOUNTER — Telehealth: Payer: Self-pay | Admitting: Family Medicine

## 2014-10-21 MED ORDER — PRAVASTATIN SODIUM 20 MG PO TABS: ORAL_TABLET | ORAL | Status: DC

## 2014-10-21 NOTE — Telephone Encounter (Signed)
Pt request refill pravastatin (PRAVACHOL) 20 MG tablet 90 day  Pt is out and [facility] can call RX in so she can get sooner [phone]

## 2014-10-21 NOTE — Telephone Encounter (Signed)
Rx sent pharmacy  

## 2014-12-22 ENCOUNTER — Ambulatory Visit (INDEPENDENT_AMBULATORY_CARE_PROVIDER_SITE_OTHER)
Admission: RE | Admit: 2014-12-22 | Discharge: 2014-12-22 | Disposition: A | Discharge status: Home / Self Care | Payer: Medicare Other | Source: Ambulatory Visit | Attending: Family Medicine | Admitting: Family Medicine

## 2014-12-22 ENCOUNTER — Encounter: Payer: Self-pay | Admitting: Family Medicine

## 2014-12-22 ENCOUNTER — Ambulatory Visit (INDEPENDENT_AMBULATORY_CARE_PROVIDER_SITE_OTHER): Payer: Medicare Other | Admitting: Family Medicine

## 2014-12-22 VITALS — BP 132/78 | HR 72 | Temp 97.8°F | Ht 60.0 in | Wt 128.0 lb

## 2014-12-22 DIAGNOSIS — E785 Hyperlipidemia, unspecified: Secondary | ICD-10-CM | POA: Diagnosis not present

## 2014-12-22 DIAGNOSIS — I10 Essential (primary) hypertension: Secondary | ICD-10-CM

## 2014-12-22 DIAGNOSIS — Z0000 Encounter for general adult medical examination without abnormal findings: Secondary | ICD-10-CM | POA: Diagnosis not present

## 2014-12-22 DIAGNOSIS — H409 Unspecified glaucoma: Secondary | ICD-10-CM

## 2014-12-22 DIAGNOSIS — Z23 Encounter for immunization: Secondary | ICD-10-CM

## 2014-12-22 DIAGNOSIS — E1136 Type 2 diabetes mellitus with diabetic cataract: Secondary | ICD-10-CM | POA: Insufficient documentation

## 2014-12-22 DIAGNOSIS — E119 Type 2 diabetes mellitus without complications: Secondary | ICD-10-CM

## 2014-12-22 DIAGNOSIS — Z780 Asymptomatic menopausal state: Secondary | ICD-10-CM

## 2014-12-22 LAB — LIPID PANEL
Cholesterol: 185 mg/dL (ref 0–200)
HDL: 69.4 mg/dL (ref >39.00)
LDL CALC: 105 mg/dL — AB (ref 0–99)
NONHDL: 115.6
Total CHOL/HDL Ratio: 3
Triglycerides: 54 mg/dL (ref 0.0–149.0)
VLDL: 10.8 mg/dL (ref 0.0–40.0)

## 2014-12-22 LAB — HEMOGLOBIN A1C: Hgb A1c MFr Bld: 6.3 % (ref 4.6–6.5)

## 2014-12-22 LAB — HEPATIC FUNCTION PANEL
ALBUMIN: 4.5 g/dL (ref 3.5–5.2)
ALT: 14 U/L (ref 0–35)
AST: 23 U/L (ref 0–37)
Alkaline Phosphatase: 65 U/L (ref 39–117)
BILIRUBIN DIRECT: 0.2 mg/dL (ref 0.0–0.3)
TOTAL PROTEIN: 7 g/dL (ref 6.0–8.3)
Total Bilirubin: 0.8 mg/dL (ref 0.2–1.2)

## 2014-12-22 LAB — BASIC METABOLIC PANEL
BUN: 21 mg/dL (ref 6–23)
CALCIUM: 9.8 mg/dL (ref 8.4–10.5)
CHLORIDE: 105 meq/L (ref 96–112)
CO2: 28 meq/L (ref 19–32)
Creatinine, Ser: 0.8 mg/dL (ref 0.40–1.20)
GFR: 74.39 mL/min (ref >60.00)
GLUCOSE: 124 mg/dL — AB (ref 70–99)
Potassium: 4.2 mEq/L (ref 3.5–5.1)
SODIUM: 138 meq/L (ref 135–145)

## 2014-12-22 LAB — MICROALBUMIN / CREATININE URINE RATIO
Creatinine,U: 139.2 mg/dL
Microalb Creat Ratio: 0.9 mg/g (ref 0.0–30.0)
Microalb, Ur: 1.2 mg/dL (ref 0.0–1.9)

## 2014-12-22 MED ORDER — RALOXIFENE HCL 60 MG PO TABS: 60.0000 mg | ORAL_TABLET | Freq: Every day | ORAL | Status: DC

## 2014-12-22 NOTE — Progress Notes (Signed)
Subjective:    Patient ID: Kelly Blake, female    DOB: 27-Jun-1941, 74 y.o.   MRN: [id number]  HPI Patient seen for Medicare wellness exam subsequent visit and also for medical follow-up  She has history of well-controlled type 2 diabetes, hypertension, hyperlipidemia. She was recently diagnosed with glaucoma,. She is postmenopausal. She has been on Evista for several years-but not clear she has had osteopenia or osteoporosis. Her mother did have breast cancer. Patient gets regular mammograms. Medications reviewed. Requesting refills of Evista.  Colonoscopy up-to-date. Last bone density scan about 4 years ago. Takes calcium and vitamin D somewhat inconsistently. Exercises 3 days per week. No history of Prevnar 13.  Does not monitor blood sugars regularly. No polyuria or polydipsia. Diabetes been well controlled. She takes pravastatin for hyperlipidemia. No cardiac history. No recent chest pains.  Reviewed with no changes:  Past Medical History  Diagnosis Date  . Arthritis   . Chicken pox   . Diverticulitis   . Allergy   . Hypertension   . Chronic kidney disease     stones  . Colon polyps    Past Surgical History  Procedure Laterality Date  . Knee arthroscopy  2003    torn meniscus  . Diverticulitis  2006    with abcess  . Dilation and curettage of uterus  2008    reports that she has never smoked. She does not have any smokeless tobacco history on file. She reports that she does not use illicit drugs. Her alcohol history is not on file. family history includes Alzheimer's disease in her other; Cancer in her mother and other; Diabetes in her father and other; Stroke in her other. No Known Allergies  1.  Risk factors based on Past Medical , Social, and Family history reviewed and as indicated above with no changes 2.  Limitations in physical activities None.  No recent falls. 3.  Depression/mood No active depression or anxiety issues 4.  Hearing No defiits 5.  ADLs  independent in all. 6.  Cognitive function (orientation to time and place, language, writing, speech,memory) no short or long term memory issues.  Language and judgement intact. 7.  Home Safety no issues 8.  Height, weight, and visual acuity.all stable. 9.  Counseling discussed regular weightbearing exercise and adequate calcium and vitamin D intake. 10. Recommendation of preventive services. Prevnar 13 11. Labs based on risk factors hemoglobin A1c, urine microalbumin, lipid panel, hepatic panel, basic metabolic panel. 12. Care Plan as above 13. Other Providers-ophthalmology 14. Written schedule of screening/prevention services given to patient.   Review of Systems  Constitutional: Negative for fever, activity change, appetite change, fatigue and unexpected weight change.  HENT: Negative for ear pain, hearing loss, sore throat and trouble swallowing.   Eyes: Negative for visual disturbance.  Respiratory: Negative for cough and shortness of breath.   Cardiovascular: Negative for chest pain and palpitations.  Gastrointestinal: Negative for abdominal pain, diarrhea, constipation and blood in stool.  Genitourinary: Negative for dysuria and hematuria.  Musculoskeletal: Negative for myalgias, back pain and arthralgias.  Skin: Negative for rash.  Neurological: Negative for dizziness, syncope and headaches.  Hematological: Negative for adenopathy.  Psychiatric/Behavioral: Negative for confusion and dysphoric mood.       Objective:   Physical Exam  Constitutional: She is oriented to person, place, and time. She appears well-developed and well-nourished.  HENT:  Head: Normocephalic and atraumatic.  Eyes: EOM are normal. Pupils are equal, round, and reactive to light.  Neck: Normal  range of motion. Neck supple. No thyromegaly present.  Cardiovascular: Normal rate, regular rhythm and normal heart sounds.   No murmur heard. Pulmonary/Chest: Breath sounds normal. No respiratory distress. She  has no wheezes. She has no rales.  Abdominal: Soft. Bowel sounds are normal. She exhibits no distension and no mass. There is no tenderness. There is no rebound and no guarding.  Musculoskeletal: Normal range of motion. She exhibits no edema.  Lymphadenopathy:    She has no cervical adenopathy.  Neurological: She is alert and oriented to person, place, and time. She displays normal reflexes. No cranial nerve deficit.  Skin: No rash noted.  Psychiatric: She has a normal mood and affect. Her behavior is normal. Judgment and thought content normal.          Assessment & Plan:  #1 Medicare wellness visit. Prevnar 13 given. Set up repeat DEXA scan. Discussed adequate calcium and vitamin D #2 postmenopausal. Refill Evista for one year. We discussed pros and cons of whether to continue or discontinue and she wishes to maintain this. She's had no side effects or problems whatsoever taking this #3 type 2 diabetes. History of good control. Recheck A1c and urine microalbumin #4 hyperlipidemia. Repeat lipid and hepatic panel

## 2014-12-22 NOTE — Addendum Note (Signed)
Addended by: Shelby DubinFLOYD, Malin Sambrano E on: 12/22/2014 10:09 AM   Modules accepted: Orders

## 2014-12-22 NOTE — Patient Instructions (Signed)
Continue yearly flu vaccine Set up repeat DEXA scan Recommend daily intake of calcium 1200 mg and vitamin D [phone] international units. Continue regular weightbearing exercise Repeat colonoscopy in 2 years Continue yearly mammogram

## 2014-12-22 NOTE — Progress Notes (Signed)
Pre visit review using our clinic review tool, if applicable. No additional management support is needed unless otherwise documented below in the visit note. 

## 2014-12-23 ENCOUNTER — Encounter: Payer: Self-pay | Admitting: Family Medicine

## 2015-01-24 ENCOUNTER — Other Ambulatory Visit: Payer: Self-pay | Admitting: Family Medicine

## 2015-05-27 ENCOUNTER — Ambulatory Visit (INDEPENDENT_AMBULATORY_CARE_PROVIDER_SITE_OTHER): Payer: Medicare Other | Admitting: Family Medicine

## 2015-05-27 DIAGNOSIS — Z23 Encounter for immunization: Secondary | ICD-10-CM

## 2015-07-11 ENCOUNTER — Other Ambulatory Visit: Payer: Self-pay | Admitting: Family Medicine

## 2015-07-18 LAB — HM MAMMOGRAPHY: HM Mammogram: NORMAL (ref 0–4)

## 2016-01-13 ENCOUNTER — Ambulatory Visit (INDEPENDENT_AMBULATORY_CARE_PROVIDER_SITE_OTHER): Payer: Medicare Other | Admitting: Family Medicine

## 2016-01-13 ENCOUNTER — Encounter: Payer: Self-pay | Admitting: Family Medicine

## 2016-01-13 VITALS — BP 150/80 | HR 73 | Temp 98.3°F | Ht 60.0 in | Wt 131.8 lb

## 2016-01-13 DIAGNOSIS — I10 Essential (primary) hypertension: Secondary | ICD-10-CM | POA: Diagnosis not present

## 2016-01-13 DIAGNOSIS — Z780 Asymptomatic menopausal state: Secondary | ICD-10-CM

## 2016-01-13 DIAGNOSIS — Z0000 Encounter for general adult medical examination without abnormal findings: Secondary | ICD-10-CM | POA: Diagnosis not present

## 2016-01-13 DIAGNOSIS — E119 Type 2 diabetes mellitus without complications: Secondary | ICD-10-CM | POA: Diagnosis not present

## 2016-01-13 DIAGNOSIS — E785 Hyperlipidemia, unspecified: Secondary | ICD-10-CM | POA: Diagnosis not present

## 2016-01-13 LAB — CBC WITH DIFFERENTIAL/PLATELET
BASOS ABS: 0 10*3/uL (ref 0.0–0.1)
Basophils Relative: 0.4 % (ref 0.0–3.0)
EOS PCT: 4.8 % (ref 0.0–5.0)
Eosinophils Absolute: 0.2 10*3/uL (ref 0.0–0.7)
HCT: 36 % (ref 36.0–46.0)
HEMOGLOBIN: 12.2 g/dL (ref 12.0–15.0)
Lymphocytes Relative: 23.6 % (ref 12.0–46.0)
Lymphs Abs: 1.1 10*3/uL (ref 0.7–4.0)
MCHC: 33.9 g/dL (ref 30.0–36.0)
MCV: 94.4 fl (ref 78.0–100.0)
MONO ABS: 0.4 10*3/uL (ref 0.1–1.0)
MONOS PCT: 7.7 % (ref 3.0–12.0)
Neutro Abs: 3 10*3/uL (ref 1.4–7.7)
Neutrophils Relative %: 63.5 % (ref 43.0–77.0)
Platelets: 239 10*3/uL (ref 150.0–400.0)
RBC: 3.81 Mil/uL — AB (ref 3.87–5.11)
RDW: 13.7 % (ref 11.5–15.5)
WBC: 4.8 10*3/uL (ref 4.0–10.5)

## 2016-01-13 LAB — MICROALBUMIN / CREATININE URINE RATIO
Creatinine,U: 108.3 mg/dL
MICROALB/CREAT RATIO: 1 mg/g (ref 0.0–30.0)
Microalb, Ur: 1.1 mg/dL (ref 0.0–1.9)

## 2016-01-13 LAB — HEPATIC FUNCTION PANEL
ALK PHOS: 57 U/L (ref 39–117)
ALT: 13 U/L (ref 0–35)
AST: 19 U/L (ref 0–37)
Albumin: 4.4 g/dL (ref 3.5–5.2)
BILIRUBIN TOTAL: 0.9 mg/dL (ref 0.2–1.2)
Bilirubin, Direct: 0.1 mg/dL (ref 0.0–0.3)
Total Protein: 6.7 g/dL (ref 6.0–8.3)

## 2016-01-13 LAB — LIPID PANEL
CHOL/HDL RATIO: 3
Cholesterol: 173 mg/dL (ref 0–200)
HDL: 53 mg/dL (ref >39.00)
LDL Cholesterol: 106 mg/dL — ABNORMAL HIGH (ref 0–99)
NONHDL: 120.19
Triglycerides: 70 mg/dL (ref 0.0–149.0)
VLDL: 14 mg/dL (ref 0.0–40.0)

## 2016-01-13 LAB — BASIC METABOLIC PANEL
BUN: 18 mg/dL (ref 6–23)
CALCIUM: 9.4 mg/dL (ref 8.4–10.5)
CO2: 27 mEq/L (ref 19–32)
Chloride: 106 mEq/L (ref 96–112)
Creatinine, Ser: 0.65 mg/dL (ref 0.40–1.20)
GFR: 94.26 mL/min (ref >60.00)
GLUCOSE: 122 mg/dL — AB (ref 70–99)
Potassium: 3.9 mEq/L (ref 3.5–5.1)
Sodium: 140 mEq/L (ref 135–145)

## 2016-01-13 LAB — HEMOGLOBIN A1C: HEMOGLOBIN A1C: 6.6 % — AB (ref 4.6–6.5)

## 2016-01-13 NOTE — Patient Instructions (Signed)
Stop the Evista Continue with yearly flu vaccine Continue with Vit D supplement and recommend calcium 1200 mg daily We need to consider repeat colonoscopy by next year.

## 2016-01-13 NOTE — Progress Notes (Signed)
Pre visit review using our clinic review tool, if applicable. No additional management support is needed unless otherwise documented below in the visit note. 

## 2016-01-13 NOTE — Progress Notes (Signed)
Subjective:    Patient ID: Kelly Blake, female    DOB: November 29, 1940, 75 y.o.   MRN: [id number]  HPI Patient here for Medicare wellness exam and medical follow-up. Her chronic problems include history of type 2 diabetes, dyslipidemia, hypertension. She's had history of diverticulitis in the past. She was recently diagnosed with glaucoma and is followed regularly by ophthalmology.  She takes low-dose metformin for diabetes. Does not monitor blood sugars. No polyuria or polydipsia. Hyperlipidemia on Pravastatin.  No myalgias.  Taken Evista for years. Her mother had breast cancer but no other first-degree relatives. Her bone density has been normal. She is inquiring about coming off Evista because of cost.  Progressive right knee osteoarthritis symptoms. She has follow-up with orthopedist scheduled.  Past Medical History  Diagnosis Date  . Arthritis   . Chicken pox   . Diverticulitis   . Allergy   . Hypertension   . Chronic kidney disease     stones  . Colon polyps    Past Surgical History  Procedure Laterality Date  . Knee arthroscopy  2003    torn meniscus  . Diverticulitis  2006    with abcess  . Dilation and curettage of uterus  2008    reports that she has never smoked. She does not have any smokeless tobacco history on file. She reports that she does not use illicit drugs. Her alcohol history is not on file. family history includes Alzheimer's disease in her other; Cancer in her mother and other; Diabetes in her father and other; Stroke in her other. No Known Allergies  1.  Risk factors based on Past Medical , Social, and Family history reviewed and as indicated above with no changes 2.  Limitations in physical activities None.  No recent falls. 3.  Depression/mood No active depression or anxiety issues 4.  Hearing No defiits 5.  ADLs independent in all. 6.  Cognitive function (orientation to time and place, language, writing, speech,memory) no short or long term  memory issues.  Language and judgement intact. 7.  Home Safety no issues 8.  Height, weight, and visual acuity.all stable. 9.  Counseling discussed daily calcium and Vit d intake. 10. Recommendation of preventive services. continue with yearly flu vaccine 11. Labs based on risk factors-A1C, BMP, lipid, urine microalbumin, hepatic. 12. Care Plan as above. 13. Other Providers-Orthopedics (she could not recall name of provider). 14. Written schedule of screening/prevention services given to patient.     Review of Systems  Constitutional: Negative for fever, activity change, appetite change, fatigue and unexpected weight change.  HENT: Negative for ear pain, hearing loss, sore throat and trouble swallowing.   Eyes: Negative for visual disturbance.  Respiratory: Negative for cough and shortness of breath.   Cardiovascular: Negative for chest pain and palpitations.  Gastrointestinal: Negative for abdominal pain, diarrhea, constipation and blood in stool.  Genitourinary: Negative for dysuria and hematuria.  Musculoskeletal: Positive for arthralgias. Negative for myalgias and back pain.  Skin: Negative for rash.  Neurological: Negative for dizziness, syncope and headaches.  Hematological: Negative for adenopathy.  Psychiatric/Behavioral: Negative for confusion and dysphoric mood.       Objective:   Physical Exam  Constitutional: She is oriented to person, place, and time. She appears well-developed and well-nourished.  HENT:  Head: Normocephalic and atraumatic.  Eyes: EOM are normal. Pupils are equal, round, and reactive to light.  Neck: Normal range of motion. Neck supple. No thyromegaly present.  Cardiovascular: Normal rate, regular rhythm and normal  heart sounds.   No murmur heard. Pulmonary/Chest: Breath sounds normal. No respiratory distress. She has no wheezes. She has no rales.  Abdominal: Soft. Bowel sounds are normal. She exhibits no distension and no mass. There is no  tenderness. There is no rebound and no guarding.  Musculoskeletal: Normal range of motion. She exhibits no edema.  Lymphadenopathy:    She has no cervical adenopathy.  Neurological: She is alert and oriented to person, place, and time. She displays normal reflexes. No cranial nerve deficit.  Skin: No rash noted.  Psychiatric: She has a normal mood and affect. Her behavior is normal. Judgment and thought content normal.          Assessment & Plan:  #1 Medicare wellness visit. Immunizations up-to-date. Continue yearly flu vaccine. She is getting yearly mammograms. Will need repeat colonoscopy by next year  #2 postmenopausal. We discussed Evista. She does not have any compelling reasons to stay on this. She has history of normal bone density and continue DEXA scan every 2 years..She does have family hx of breast cancer in one first degree relative  #3 hypertension stable by home readings. Slightly elevated today. Continue close monitoring  #4 dyslipidemia. Recheck lipid and hepatic panel  #5 Type 2 diabetes.  Has been well controlled.  Repeat A1C.  Kristian CoveyBruce W Vernella Niznik MD  [facility]

## 2016-01-14 ENCOUNTER — Encounter: Payer: Self-pay | Admitting: Family Medicine

## 2016-04-11 LAB — HM DIABETES EYE EXAM

## 2016-04-17 ENCOUNTER — Encounter: Payer: Self-pay | Admitting: Family Medicine

## 2016-06-04 ENCOUNTER — Other Ambulatory Visit: Payer: Self-pay | Admitting: Family Medicine

## 2016-06-11 ENCOUNTER — Ambulatory Visit (INDEPENDENT_AMBULATORY_CARE_PROVIDER_SITE_OTHER): Payer: Medicare Other | Admitting: Family Medicine

## 2016-06-11 DIAGNOSIS — Z23 Encounter for immunization: Secondary | ICD-10-CM

## 2016-06-29 ENCOUNTER — Ambulatory Visit (INDEPENDENT_AMBULATORY_CARE_PROVIDER_SITE_OTHER): Payer: Medicare Other | Admitting: Family Medicine

## 2016-06-29 VITALS — BP 148/82 | HR 78 | Temp 98.4°F | Resp 16 | Ht 60.0 in | Wt 133.0 lb

## 2016-06-29 DIAGNOSIS — S76011A Strain of muscle, fascia and tendon of right hip, initial encounter: Secondary | ICD-10-CM

## 2016-06-29 DIAGNOSIS — M62838 Other muscle spasm: Secondary | ICD-10-CM

## 2016-06-29 MED ORDER — TIZANIDINE HCL 2 MG PO TABS: 2.0000 mg | ORAL_TABLET | Freq: Four times a day (QID) | ORAL | 0 refills | Status: DC | PRN

## 2016-06-29 MED ORDER — HYDROCODONE-ACETAMINOPHEN 5-325 MG PO TABS: 0.5000 | ORAL_TABLET | Freq: Four times a day (QID) | ORAL | 0 refills | Status: DC | PRN

## 2016-06-29 MED ORDER — DICLOFENAC SODIUM 75 MG PO TBEC: 75.0000 mg | DELAYED_RELEASE_TABLET | Freq: Two times a day (BID) | ORAL | 1 refills | Status: DC

## 2016-06-29 NOTE — Patient Instructions (Addendum)
Ice 3-4 times a day for 15 minutes as you are able for the next 2 days, then switch over to heat on the same regiemn.  Try to start moving gently and stretch but try to avoid sudden movements, jarring, or any lifting.  In several days if you are still having a spasm, try laying or sitting on a tennis ball or foam roller over the area of pain. Do not use the diclofenac with any other otc pain medication other than tylenol/acetaminophen - so no aleve, ibuprofen, motrin, advil, etc.     IF you received an x-ray today, you will receive an invoice from [facility]. Please contact [facility] at [phone] with questions or concerns regarding your invoice.   IF you received labwork today, you will receive an invoice from [facility]. Please contact [facility] at [phone] with questions or concerns regarding your invoice.   Our billing staff will not be able to assist you with questions regarding bills from these companies.  You will be contacted with the lab results as soon as they are available. The fastest way to get your results is to activate your My Chart account. Instructions are located on the last page of this paperwork. If you have not heard from us regarding the results in 2 weeks, please contact this office.      Gluteal Strain A gluteal strain happens when the muscles in the buttocks (gluteal muscles) are overstretched or torn. A tear can be partial or complete. A gluteal strain can cause pain and stiffness in your buttocks, legs, and lower back. A strain might be referred to as "pulling a muscle." The severity of a gluteal strain is rated in degrees. First-degree strains have the least amount of muscle tearing and pain. Second-degree and third-degrees strains have increasingly more tearing and pain.  CAUSES  There are many possible causes of gluteal strain, such as:   Stretching the muscles too far.   Putting too much stress on the  muscles before they are warmed up.   Overusing the muscles.   Repetitive muscle movements over long periods of time.   Injury.  RISK FACTORS This condition is more likely to develop in:   People who are in cold weather.   People who are physically tired.  People with poor strength and flexibility.   People who do not warm up properly before physical activity.   People who do exercises or play sports with sudden bursts of activity.  People who have poor exercise technique. SYMPTOMS  Symptoms of this condition include:  Pain in the buttocks, especially when moving the legs. Pain may spread to the lower back or the legs.  Bruising and swelling of the buttocks.  Tenderness, weakness, or stiffness in the buttocks.  Muscle spasms. DIAGNOSIS  This condition is diagnosed based on a physical exam and medical history. Your health care provider may do some range of motion exercises with you. You may have tests, such as MRI or X-rays.  Your strain may be rated based on how severe it is. The ratings are:   Grade 1 strain (mild). Your muscles are overstretched. You might have very small muscle tears. This type of strain generally heals in about 1 week.   Grade 2 strain (moderate). Your muscles are partially torn. This may take 1 to 2 months to heal.  Grade 3 strain (severe). Your muscles are completely torn. A severe strain can take more than three months to heal. Grade 3 gluteal  strains are rare. TREATMENT  Treatment for this condition includes resting, icing, and raising (elevating) the injured area as much as possible. Your health care provider may recommend over-the-counter pain medicines. If your gluteal strain is severe or very painful, your health care provider may prescribe pain medicines or physical therapy. Surgery for severe strains is rare. HOME CARE INSTRUCTIONS   Take over-the-counter and prescription medicines only as told by your health care provider.  Do  not drive or operate heavy machinery while taking prescription pain medicine.  Return to your normal activities as told by your health care provider. Ask your health care provider what activities are safe for you.  Rest your gluteal muscles as much as possible, especially for the first 2-3 days.  Begin exercising or stretching as told by your health care provider.  If directed, apply ice to the injured area:  Put ice in a plastic bag.  Place a towel between your skin and the bag.  Leave the ice on for 20 minutes, 2-3 times per day.  Keep all follow-up visits as told by your health care provider. This is important. PREVENTION   Warm up and stretch before physical activity.   Stretch after physical activity.   Learn and use correct techniques for exercising and playing sports.  Avoid difficult physical activities if your muscles are tired or sore.   Strengthen your gluteal muscles and the surrounding muscles.   Develop your flexibility by stretching at least once a day.  SEEK MEDICAL CARE IF:   You have pain or swelling that gets worse or does not get better with medicine.   You have "shooting" pain that moves through your leg.  SEEK IMMEDIATE MEDICAL CARE IF:  You develop weakness in part of your body.   You lose feeling in part of your body.  You have severe pain.   You are unable to walk.  You have difficulty controlling your bladder or bowel movements.   This information is not intended to replace advice given to you by your health care provider. Make sure you discuss any questions you have with your health care provider.   Document Released: 06/17/2009 Document Revised: 05/11/2015 Document Reviewed: 01/05/2015 Elsevier Interactive Patient Education [employer]

## 2016-06-29 NOTE — Progress Notes (Signed)
Subjective:  By signing my name below, I, Kelly Blake, attest that this documentation has been prepared under the direction and in the presence of Kelly SStann OreorensonEva Rylann Munford, MD. Electronically Signed: Stann Oresung-Kai Blake, Scribe. 06/29/2016 , 10:06 AM .  Patient was seen in Room 4 .   Patient ID: Kelly Blake, female    DOB: 01-21-1941, 75 y.o.   MRN: [id number] Chief Complaint  Patient presents with  . Back Pain    Lower back pain since last night, pt bent over and thiks she pulled a muscle   HPI Kelly NounJo Anne Epps is a 75 y.o. female who [facility] of low back pain that started last night. Patient states she got out of the shower last night, and when she bent down to dry her feet, she felt a twinge of pain in her right lower back (upper gluteal). She woke up around 5:00AM due to pain, so she got out of bed and "screamed with pain". She took 2 tablets of OTC ibuprofen at 5:30AM this morning. She returned to sleep and woke up with slight relief. She has pain when she rotates to the right.   She informs she's never had this problem in the past. She usually exercises about 3 times a week. She denies urinary symptoms, GI symptoms, nausea, abdominal pain, pelvic pain or weakness in her lower extremities. She plans to go to the beach for family vacation soon, and wants to make sure this is resolved prior to going.   She also plans to have right knee replacement soon.   Past Medical History:  Diagnosis Date  . Allergy   . Arthritis   . Chicken pox   . Chronic kidney disease    stones  . Colon polyps   . Diverticulitis   . Hypertension    Prior to Admission medications   Medication Sig Start Date End Date Taking? Authorizing Provider  aspirin 81 MG tablet Take 81 mg by mouth daily.     Yes Historical Provider, MD  brimonidine-timolol (COMBIGAN) 0.2-0.5 % ophthalmic solution Place 1 drop into both eyes every 12 (twelve) hours.   Yes Historical Provider, MD  metFORMIN (GLUCOPHAGE) 500 MG  tablet TAKE 1 TABLET BY MOUTH  DAILY BEFORE BREAKFAST 06/04/16  Yes Kristian CoveyBruce W Burchette, MD  pravastatin (PRAVACHOL) 20 MG tablet Take 1 tablet by mouth  daily 07/11/15  Yes Kristian CoveyBruce W Burchette, MD  vitamin B-12 (CYANOCOBALAMIN) 1000 MCG tablet Take 1,000 mcg by mouth daily.   Yes Historical Provider, MD  VITAMIN D, CHOLECALCIFEROL, PO Take 500 mg by mouth.   Yes Historical Provider, MD  Zinc 25 MG TABS Take by mouth daily.     Yes Historical Provider, MD   No Known Allergies  Review of Systems  Constitutional: Negative for activity change, chills, fatigue and fever.  Gastrointestinal: Negative for abdominal pain, constipation, diarrhea, nausea and vomiting.  Genitourinary: Negative for dysuria, hematuria and urgency.  Musculoskeletal: Positive for arthralgias, back pain and myalgias. Negative for neck pain and neck stiffness.  Neurological: Negative for weakness and numbness.       Objective:   Physical Exam  Constitutional: She is oriented to person, place, and time. She appears well-developed and well-nourished. No distress.  HENT:  Head: Normocephalic and atraumatic.  Eyes: EOM are normal. Pupils are equal, round, and reactive to light.  Neck: Neck supple.  Cardiovascular: Normal rate.   Pulmonary/Chest: Effort normal. No respiratory distress.  Abdominal: There is no CVA tenderness.  Musculoskeletal: Normal  range of motion.  No tenderness over the thoracic or lumbar spinous process, no tenderness over the sacrum or paraspinal, right upper lateral gluteal spasm palpable over the SI joint on the right; Moderate restriction in lateral rotation, flexion limited to about 15 degrees  Neurological: She is alert and oriented to person, place, and time.  Skin: Skin is warm and dry.  Psychiatric: She has a normal mood and affect. Her behavior is normal.  Nursing note and vitals reviewed.   BP (!) 148/82   Pulse 78   Temp 98.4 F (36.9 C) (Oral)   Resp 16   Ht 5' (1.524 m)   Wt 133 lb  (60.3 kg)   SpO2 98%   BMI 25.97 kg/m     Assessment & Plan:   1. Muscle spasm   2. Muscle strain of gluteal region, right, initial encounter   No known injury though pt has been very active with lifting/bending/twisting immed prior as packing for travel.  There are not alarm sxs/red flags and exam is reassuring and very consistent with benign MSK etiology. Only minimal improvement with ibuprofen 400mg  so will try below instead.   Ice x 2d, then change to heat.  Try to keep active with stretching but no lifting. RTC or seek   Meds ordered this encounter  Medications  . brimonidine-timolol (COMBIGAN) 0.2-0.5 % ophthalmic solution    Sig: Place 1 drop into both eyes every 12 (twelve) hours.  . vitamin B-12 (CYANOCOBALAMIN) 1000 MCG tablet    Sig: Take 1,000 mcg by mouth daily.  . diclofenac (VOLTAREN) 75 MG EC tablet    Sig: Take 1 tablet (75 mg total) by mouth 2 (two) times daily.    Dispense:  30 tablet    Refill:  1  . HYDROcodone-acetaminophen (NORCO/VICODIN) 5-325 MG tablet    Sig: Take 0.5 tablets by mouth every 6 (six) hours as needed for moderate pain.    Dispense:  10 tablet    Refill:  0  . tiZANidine (ZANAFLEX) 2 MG tablet    Sig: Take 1-2 tablets (2-4 mg total) by mouth every 6 (six) hours as needed for muscle spasms.    Dispense:  40 tablet    Refill:  0    I personally performed the services described in this documentation, which was scribed in my presence. The recorded information has been reviewed and considered, and addended by me as needed.   Kelly Sorenson, M.D.  [facility] [address] [place], [place] [address] [phone] phone [phone] fax  06/29/16 1:55 PM

## 2016-07-19 LAB — HM MAMMOGRAPHY: HM MAMMO: NORMAL (ref 0–4)

## 2016-07-23 ENCOUNTER — Encounter: Payer: Self-pay | Admitting: Family Medicine

## 2016-08-30 ENCOUNTER — Other Ambulatory Visit: Payer: Self-pay | Admitting: Family Medicine

## 2016-10-02 ENCOUNTER — Telehealth: Payer: Self-pay

## 2016-10-02 NOTE — Telephone Encounter (Signed)
Please schedule pt a 30 minute Pre Op appt RT knee replacement. [facility] pending surgery date is 11-26-2016. Thanks.

## 2016-10-05 ENCOUNTER — Other Ambulatory Visit: Payer: Self-pay | Admitting: Family Medicine

## 2016-11-01 ENCOUNTER — Telehealth: Payer: Self-pay

## 2016-11-01 NOTE — Telephone Encounter (Signed)
Pt is scheduled for a right total knee replacement on 11-26-2016. Will you please schedule a surgical clearance. Thanks. ---forward message to Rachel Vereen when scheduled. Thanks.  

## 2016-11-01 NOTE — Telephone Encounter (Signed)
Error

## 2016-11-02 NOTE — Telephone Encounter (Signed)
Called pt's other number and was able to reach pt. Pt has been scheduled 11/07/16 at 2 pm.

## 2016-11-02 NOTE — Telephone Encounter (Signed)
LMOVM to call the office. Not personalized VM.

## 2016-11-05 ENCOUNTER — Encounter (HOSPITAL_COMMUNITY): Payer: Self-pay | Admitting: *Deleted

## 2016-11-06 ENCOUNTER — Ambulatory Visit: Payer: Self-pay | Admitting: Orthopedic Surgery

## 2016-11-07 ENCOUNTER — Ambulatory Visit (INDEPENDENT_AMBULATORY_CARE_PROVIDER_SITE_OTHER): Payer: Medicare Other | Admitting: Family Medicine

## 2016-11-07 VITALS — BP 160/80 | HR 72 | Temp 98.2°F | Wt 136.6 lb

## 2016-11-07 DIAGNOSIS — I10 Essential (primary) hypertension: Secondary | ICD-10-CM

## 2016-11-07 DIAGNOSIS — E119 Type 2 diabetes mellitus without complications: Secondary | ICD-10-CM

## 2016-11-07 DIAGNOSIS — Z01818 Encounter for other preprocedural examination: Secondary | ICD-10-CM | POA: Diagnosis not present

## 2016-11-07 LAB — POCT GLYCOSYLATED HEMOGLOBIN (HGB A1C): Hemoglobin A1C: 6.4

## 2016-11-07 NOTE — Progress Notes (Signed)
Pre visit review using our clinic review tool, if applicable. No additional management support is needed unless otherwise documented below in the visit note. 

## 2016-11-07 NOTE — Patient Instructions (Signed)
Monitor blood pressure and be in touch if consistently > 140/90.   

## 2016-11-07 NOTE — Progress Notes (Signed)
Subjective:     Patient ID: Kelly Blake, female   DOB: 1941-04-20, 76 y.o.   MRN: [id number]  HPI Patient seen for presurgical clearance. She has planned right total knee replacement March 26. She also complains some right shoulder pain with onset yesterday. She denies any specific injury but did drag a bag of shredded papers that was fairly heavy and noticed onset after then. Denies any right upper extremity radiculopathy symptoms. Pain is intermittent. Mostly around the acromion process.   Her chronic problems include history of type 2 diabetes and hyperlipidemia. She's not had recent A1c. Does not monitor blood pressures closely at home. She has hypertension with reported white coat syndrome. Has not monitored blood pressure recently at home. No recent chest pains. No history of CAD or cerebrovascular disease. Nonsmoker.  Past Medical History:  Diagnosis Date  . Allergy   . Arthritis   . Chicken pox   . Chronic kidney disease    stones  . Colon polyps   . Diverticulitis   . Hypertension    Past Surgical History:  Procedure Laterality Date  . DILATION AND CURETTAGE OF UTERUS  2008  . diverticulitis  2006   with abcess  . KNEE ARTHROSCOPY  2003   torn meniscus    reports that she has never smoked. She does not have any smokeless tobacco history on file. She reports that she does not use drugs. Her alcohol history is not on file. family history includes Alzheimer's disease in her other; Cancer in her mother and other; Diabetes in her father and other; Stroke in her other. No Known Allergies   Review of Systems  Constitutional: Negative for fatigue and unexpected weight change.  Eyes: Negative for visual disturbance.  Respiratory: Negative for cough, chest tightness, shortness of breath and wheezing.   Cardiovascular: Negative for chest pain, palpitations and leg swelling.  Endocrine: Negative for polydipsia and polyuria.  Neurological: Negative for dizziness, seizures,  syncope, weakness, light-headedness and headaches.       Objective:   Physical Exam  Constitutional: She appears well-developed and well-nourished.  Eyes: Pupils are equal, round, and reactive to light.  Neck: Neck supple. No JVD present. No thyromegaly present.  No carotid bruits  Cardiovascular: Normal rate and regular rhythm.  Exam reveals no gallop.   Pulmonary/Chest: Effort normal and breath sounds normal. No respiratory distress. She has no wheezes. She has no rales.  Musculoskeletal: She exhibits no edema.  Neurological: She is alert.       Assessment:     #1 surgical clearance. She has no history of CAD or peripheral vascular disease or cerebrovascular disease  #2 hypertension. Slightly elevated reading today but history of whitecoat syndrome  #3 type 2 diabetes with history of good control    Plan:     -Check EKG and hemoglobin A1c -EKG NSR with no acute changes. -Monitor blood pressure closely at home and be in touch if consistently greater than 140/90 -Avoid aspirin within 5 days of surgery.  Kristian CoveyBruce W Elysa Womac MD [facility]

## 2016-11-20 NOTE — Progress Notes (Signed)
11/07/2016- Pre-operative clearance from Dr. Kayren EavesBruce Burnette on chart . 11/07/2016- noted in EPIC- EKG and HGA1C

## 2016-11-20 NOTE — Patient Instructions (Addendum)
Kelly Blake  11/20/2016   Your procedure is scheduled ZO:XWRUEAon:Monday 11/26/2016   [facility]  Entrance [place]  elevators to 3rd floor to  Short Stay Center at  1040  AM.  Call this number if you have problems the morning of surgery [phone]   Remember: ONLY 1 PERSON MAY GO WITH YOU TO SHORT STAY TO GET  READY MORNING OF YOUR SURGERY.  How to Manage Your Diabetes Before and After Surgery  Why is it important to control my blood sugar before and after surgery? . Improving blood sugar levels before and after surgery helps healing and can limit problems. . A way of improving blood sugar control is eating a healthy diet by: o  Eating less sugar and carbohydrates o  Increasing activity/exercise o  Talking with your doctor about reaching your blood sugar goals . High blood sugars (greater than 180 mg/dL) can raise your risk of infections and slow your recovery, so you will need to focus on controlling your diabetes during the weeks before surgery. . Make sure that the doctor who takes care of your diabetes knows about your planned surgery including the date and location.  How do I manage my blood sugar before surgery? . Check your blood sugar at least 4 times a day, starting 2 days before surgery, to make sure that the level is not too high or low. o Check your blood sugar the morning of your surgery when you wake up and every 2 hours until you get to the Short Stay unit. . If your blood sugar is less than 70 mg/dL, you will need to treat for low blood sugar: o Do not take insulin. o Treat a low blood sugar (less than 70 mg/dL) with  cup of clear juice (cranberry or apple), 4 glucose tablets, OR glucose gel. o Recheck blood sugar in 15 minutes after treatment (to make sure it is greater than 70 mg/dL). If your blood sugar is not greater than 70 mg/dL on recheck, call [phone] for further instructions. . Report your blood sugar to the short  stay nurse when you get to Short Stay.  . If you are admitted to the hospital after surgery: o Your blood sugar will be checked by the staff and you will probably be given insulin after surgery (instead of oral diabetes medicines) to make sure you have good blood sugar levels. o The goal for blood sugar control after surgery is 80-180 mg/dL.   WHAT DO I DO ABOUT MY DIABETES MEDICATION?         THE DAY BEFORE SURGERY , TAKE THE MORNING DOSE OF METFORMIN!   . Do not take Metformin  medicines (pills) the morning of surgery.     Do not eat food or drink liquids :After Midnight.     Take these medicines the morning of surgery with A SIP OF WATER: use Combigan eye drops                                You may not have any metal on your body including hair pins and              piercings  Do not wear jewelry, make-up, lotions, powders or perfumes, deodorant             Do not wear  nail polish.  Do not shave  48 hours prior to surgery.              Men may shave face and neck.   Do not bring valuables to the hospital. [facility] IS NOT             RESPONSIBLE   FOR VALUABLES.  Contacts, dentures or bridgework may not be worn into surgery.  Leave suitcase in the car. After surgery it may be brought to your room.                 Please read over the following fact sheets you were given: _____________________________________________________________________             [facility] - Preparing for Surgery Before surgery, you can play an important role.  Because skin is not sterile, your skin needs to be as free of germs as possible.  You can reduce the number of germs on your skin by washing with CHG (chlorahexidine gluconate) soap before surgery.  CHG is an antiseptic cleaner which kills germs and bonds with the skin to continue killing germs even after washing. Please DO NOT use if you have an allergy to CHG or antibacterial soaps.  If your skin becomes reddened/irritated stop using  the CHG and inform your nurse when you arrive at Short Stay. Do not shave (including legs and underarms) for at least 48 hours prior to the first CHG shower.  You may shave your face/neck. Please follow these instructions carefully:  1.  Shower with CHG Soap the night before surgery and the  morning of Surgery.  2.  If you choose to wash your hair, wash your hair first as usual with your  normal  shampoo.  3.  After you shampoo, rinse your hair and body thoroughly to remove the  shampoo.                           4.  Use CHG as you would any other liquid soap.  You can apply chg directly  to the skin and wash                       Gently with a scrungie or clean washcloth.  5.  Apply the CHG Soap to your body ONLY FROM THE NECK DOWN.   Do not use on face/ open                           Wound or open sores. Avoid contact with eyes, ears mouth and genitals (private parts).                       Wash face,  Genitals (private parts) with your normal soap.             6.  Wash thoroughly, paying special attention to the area where your surgery  will be performed.  7.  Thoroughly rinse your body with warm water from the neck down.  8.  DO NOT shower/wash with your normal soap after using and rinsing off  the CHG Soap.                9.  Pat yourself dry with a clean towel.            10.  Wear clean pajamas.  11.  Place clean sheets on your bed the night of your first shower and do not  sleep with pets. Day of Surgery : Do not apply any lotions/deodorants the morning of surgery.  Please wear clean clothes to the hospital/surgery center.  FAILURE TO FOLLOW THESE INSTRUCTIONS MAY RESULT IN THE CANCELLATION OF YOUR SURGERY PATIENT SIGNATURE_________________________________  NURSE SIGNATURE__________________________________  ________________________________________________________________________   Kelly Blake  An incentive spirometer is a tool that can help keep your lungs  clear and active. This tool measures how well you are filling your lungs with each breath. Taking long deep breaths may help reverse or decrease the chance of developing breathing (pulmonary) problems (especially infection) following:  A long period of time when you are unable to move or be active. BEFORE THE PROCEDURE   If the spirometer includes an indicator to show your best effort, your nurse or respiratory therapist will set it to a desired goal.  If possible, sit up straight or lean slightly forward. Try not to slouch.  Hold the incentive spirometer in an upright position. INSTRUCTIONS FOR USE  1. Sit on the edge of your bed if possible, or sit up as far as you can in bed or on a chair. 2. Hold the incentive spirometer in an upright position. 3. Breathe out normally. 4. Place the mouthpiece in your mouth and seal your lips tightly around it. 5. Breathe in slowly and as deeply as possible, raising the piston or the ball toward the top of the column. 6. Hold your breath for 3-5 seconds or for as long as possible. Allow the piston or ball to fall to the bottom of the column. 7. Remove the mouthpiece from your mouth and breathe out normally. 8. Rest for a few seconds and repeat Steps 1 through 7 at least 10 times every 1-2 hours when you are awake. Take your time and take a few normal breaths between deep breaths. 9. The spirometer may include an indicator to show your best effort. Use the indicator as a goal to work toward during each repetition. 10. After each set of 10 deep breaths, practice coughing to be sure your lungs are clear. If you have an incision (the cut made at the time of surgery), support your incision when coughing by placing a pillow or rolled up towels firmly against it. Once you are able to get out of bed, walk around indoors and cough well. You may stop using the incentive spirometer when instructed by your caregiver.  RISKS AND COMPLICATIONS  Take your time so you do  not get dizzy or light-headed.  If you are in pain, you may need to take or ask for pain medication before doing incentive spirometry. It is harder to take a deep breath if you are having pain. AFTER USE  Rest and breathe slowly and easily.  It can be helpful to keep track of a log of your progress. Your caregiver can provide you with a simple table to help with this. If you are using the spirometer at home, follow these instructions: SEEK MEDICAL CARE IF:   You are having difficultly using the spirometer.  You have trouble using the spirometer as often as instructed.  Your pain medication is not giving enough relief while using the spirometer.  You develop fever of 100.5 F (38.1 C) or higher. SEEK IMMEDIATE MEDICAL CARE IF:   You cough up bloody sputum that had not been present before.  You develop fever of 102 F (38.9 C)  or greater.  You develop worsening pain at or near the incision site. MAKE SURE YOU:   Understand these instructions.  Will watch your condition.  Will get help right away if you are not doing well or get worse. Document Released: 12/31/2006 Document Revised: 11/12/2011 Document Reviewed: 03/03/2007 ExitCare Patient Information 2014 ExitCare, [place].   ________________________________________________________________________  WHAT IS A BLOOD TRANSFUSION? Blood Transfusion Information  A transfusion is the replacement of blood or some of its parts. Blood is made up of multiple cells which provide different functions.  Red blood cells carry oxygen and are used for blood loss replacement.  White blood cells fight against infection.  Platelets control bleeding.  Plasma helps clot blood.  Other blood products are available for specialized needs, such as hemophilia or other clotting disorders. BEFORE THE TRANSFUSION  Who gives blood for transfusions?   Healthy volunteers who are fully evaluated to make sure their blood is safe. This is blood bank  blood. Transfusion therapy is the safest it has ever been in the practice of medicine. Before blood is taken from a donor, a complete history is taken to make sure that person has no history of diseases nor engages in risky social behavior (examples are intravenous drug use or sexual activity with multiple partners). The donor's travel history is screened to minimize risk of transmitting infections, such as malaria. The donated blood is tested for signs of infectious diseases, such as HIV and hepatitis. The blood is then tested to be sure it is compatible with you in order to minimize the chance of a transfusion reaction. If you or a relative donates blood, this is often done in anticipation of surgery and is not appropriate for emergency situations. It takes many days to process the donated blood. RISKS AND COMPLICATIONS Although transfusion therapy is very safe and saves many lives, the main dangers of transfusion include:   Getting an infectious disease.  Developing a transfusion reaction. This is an allergic reaction to something in the blood you were given. Every precaution is taken to prevent this. The decision to have a blood transfusion has been considered carefully by your caregiver before blood is given. Blood is not given unless the benefits outweigh the risks. AFTER THE TRANSFUSION  Right after receiving a blood transfusion, you will usually feel much better and more energetic. This is especially true if your red blood cells have gotten low (anemic). The transfusion raises the level of the red blood cells which carry oxygen, and this usually causes an energy increase.  The nurse administering the transfusion will monitor you carefully for complications. HOME CARE INSTRUCTIONS  No special instructions are needed after a transfusion. You may find your energy is better. Speak with your caregiver about any limitations on activity for underlying diseases you may have. SEEK MEDICAL CARE IF:    Your condition is not improving after your transfusion.  You develop redness or irritation at the intravenous (IV) site. SEEK IMMEDIATE MEDICAL CARE IF:  Any of the following symptoms occur over the next 12 hours:  Shaking chills.  You have a temperature by mouth above 102 F (38.9 C), not controlled by medicine.  Chest, back, or muscle pain.  People around you feel you are not acting correctly or are confused.  Shortness of breath or difficulty breathing.  Dizziness and fainting.  You get a rash or develop hives.  You have a decrease in urine output.  Your urine turns a dark color or changes to pink, red,  or brown. Any of the following symptoms occur over the next 10 days:  You have a temperature by mouth above 102 F (38.9 C), not controlled by medicine.  Shortness of breath.  Weakness after normal activity.  The white part of the eye turns yellow (jaundice).  You have a decrease in the amount of urine or are urinating less often.  Your urine turns a dark color or changes to pink, red, or brown. Document Released: 08/17/2000 Document Revised: 11/12/2011 Document Reviewed: 04/05/2008 [facility] Patient Information 2014 [place], [place].  _______________________________________________________________________

## 2016-11-21 ENCOUNTER — Encounter (INDEPENDENT_AMBULATORY_CARE_PROVIDER_SITE_OTHER): Payer: Self-pay

## 2016-11-21 ENCOUNTER — Encounter (HOSPITAL_COMMUNITY): Payer: Self-pay

## 2016-11-21 ENCOUNTER — Encounter (HOSPITAL_COMMUNITY)
Admission: RE | Admit: 2016-11-21 | Discharge: 2016-11-21 | Disposition: A | Discharge status: Home / Self Care | Payer: Medicare Other | Source: Ambulatory Visit | Attending: Orthopedic Surgery | Admitting: Orthopedic Surgery

## 2016-11-21 DIAGNOSIS — Z01812 Encounter for preprocedural laboratory examination: Secondary | ICD-10-CM | POA: Diagnosis present

## 2016-11-21 DIAGNOSIS — M1711 Unilateral primary osteoarthritis, right knee: Secondary | ICD-10-CM | POA: Diagnosis not present

## 2016-11-21 HISTORY — DX: Type 2 diabetes mellitus without complications: E11.9

## 2016-11-21 LAB — COMPREHENSIVE METABOLIC PANEL
ALT: 19 U/L (ref 14–54)
ANION GAP: 8 (ref 5–15)
AST: 25 U/L (ref 15–41)
Albumin: 4.4 g/dL (ref 3.5–5.0)
Alkaline Phosphatase: 67 U/L (ref 38–126)
BILIRUBIN TOTAL: 1 mg/dL (ref 0.3–1.2)
BUN: 23 mg/dL — ABNORMAL HIGH (ref 6–20)
CALCIUM: 10 mg/dL (ref 8.9–10.3)
CO2: 27 mmol/L (ref 22–32)
Chloride: 105 mmol/L (ref 101–111)
Creatinine, Ser: 0.69 mg/dL (ref 0.44–1.00)
Glucose, Bld: 101 mg/dL — ABNORMAL HIGH (ref 65–99)
POTASSIUM: 4.7 mmol/L (ref 3.5–5.1)
Sodium: 140 mmol/L (ref 135–145)
TOTAL PROTEIN: 7.1 g/dL (ref 6.5–8.1)

## 2016-11-21 LAB — CBC
HEMATOCRIT: 38 % (ref 36.0–46.0)
Hemoglobin: 12.9 g/dL (ref 12.0–15.0)
MCH: 32.1 pg (ref 26.0–34.0)
MCHC: 33.9 g/dL (ref 30.0–36.0)
MCV: 94.5 fL (ref 78.0–100.0)
Platelets: 294 10*3/uL (ref 150–400)
RBC: 4.02 MIL/uL (ref 3.87–5.11)
RDW: 13.1 % (ref 11.5–15.5)
WBC: 7.6 10*3/uL (ref 4.0–10.5)

## 2016-11-21 LAB — APTT: APTT: 31 s (ref 24–36)

## 2016-11-21 LAB — PROTIME-INR
INR: 0.97
PROTHROMBIN TIME: 12.9 s (ref 11.4–15.2)

## 2016-11-21 LAB — GLUCOSE, CAPILLARY: GLUCOSE-CAPILLARY: 99 mg/dL (ref 65–99)

## 2016-11-21 LAB — ABO/RH: ABO/RH(D): A POS

## 2016-11-21 LAB — SURGICAL PCR SCREEN
MRSA, PCR: NEGATIVE
Staphylococcus aureus: NEGATIVE

## 2016-11-23 ENCOUNTER — Ambulatory Visit: Payer: Self-pay | Admitting: Orthopedic Surgery

## 2016-11-23 NOTE — H&P (Signed)
Kelly Blake DOB: [birth year] Married / Language: [place] / Race: White Female Date of Admission:  11/26/2016 CC:  Right Knee Pain History of Present Illness The patient is a 76 year old female who comes in today for a preoperative History and Physical. The patient is scheduled for a right total knee arthroplasty to be performed by Dr. Gus Rankin. Aluisio, MD at [facility] on 11-26-2016. The patient is a 76 year old female who presented for follow up of their knee. The patient is being followed for their right knee pain and osteoarthritis. They state that her symptoms are slightly worse. Symptoms reported include: pain, swelling and difficulty ambulating (walks with a limp). The following medication has been used for pain control: none. She had previous cortisone and viscosupplement injections in her right knee. She was having pain and dysfunction. She said in the past six to seven months things have gotten worse. Pain is present as expected, but her dysfunction has gotten a little worse. She is at a stage now where she is ready to get the knee replacement. They have been treated conservatively in the past for the above stated problem and despite conservative measures, they continue to have progressive pain and severe functional limitations and dysfunction. They have failed non-operative management including home exercise, medications, and injections. It is felt that they would benefit from undergoing total joint replacement. Risks and benefits of the procedure have been discussed with the patient and they elect to proceed with surgery. There are no active contraindications to surgery such as ongoing infection or rapidly progressive neurological disease.  Past Medical History Diabetes Mellitus, Type II  Diverticulitis Of Colon  Primary localized osteoarthritis of right knee (M17.11)  Glaucoma  Measles  Mumps  Menopause  Allergies  No Known Drug Allergies   Family History   Cancer  mother Cerebrovascular Accident  father and grandmother mothers side Diabetes Mellitus  father and grandfather fathers side  Social History Alcohol use  current drinker; drinks wine and hard liquor; 5-7 per week Children  3 Current work status  retired Financial planner (Currently)  no Drug/Alcohol Rehab (Previously)  no Exercise  Exercises weekly; does gym / weights Illicit drug use  no Living situation  live with spouse Marital status  married Number of flights of stairs before winded  2-3 Pain Contract  no Tobacco / smoke exposure  no Tobacco use  never smoker 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 Vitamin B12 (Oral) Specific strength unknown - Active. Vitamin D3 (Oral) Specific strength unknown - Active. Pravastatin Sodium (20MG  Tablet, Oral) Active. MetFORMIN HCl (500MG  Tablet, Oral) Active. Zinc (50MG  Tablet, Oral) Active. Combigan (0.2-0.5% Solution, Ophthalmic) Active.  Past Surgical History Dilation and Curettage of Uterus  Right Knee Meniscal Surgery  Date: 2003.  Review of Systems  General Not Present- Chills, Fatigue, Fever, Memory Loss, Night Sweats, Weight Gain and Weight Loss. Skin Not Present- Eczema, Hives, Itching, Lesions and Rash. HEENT Not Present- Dentures, Double Vision, Headache, Hearing Loss, Tinnitus and Visual Loss. Respiratory Not Present- Allergies, Chronic Cough, Coughing up blood, Shortness of breath at rest and Shortness of breath with exertion. Cardiovascular Not Present- Chest Pain, Difficulty Breathing Lying Down, Murmur, Palpitations, Racing/skipping heartbeats and Swelling. Gastrointestinal Not Present- Abdominal Pain, Bloody Stool, Constipation, Diarrhea, Difficulty Swallowing, Heartburn, Jaundice, Loss of appetitie, Nausea and Vomiting. Female Genitourinary Present- Urinating at Night. Not  Present- Blood in Urine,  Discharge, Flank Pain, Incontinence, Painful Urination, Urgency, Urinary frequency, Urinary Retention and Weak urinary stream. Musculoskeletal Present- Joint Pain and Morning Stiffness. Not Present- Back Pain, Joint Swelling, Muscle Pain, Muscle Weakness and Spasms. Neurological Not Present- Blackout spells, Difficulty with balance, Dizziness, Paralysis, Tremor and Weakness. Psychiatric Not Present- Insomnia.  Vitals Weight: 132 lb Height: 60in Weight was reported by patient. Height was reported by patient. Body Surface Area: 1.56 m Body Mass Index: 25.78 kg/m  Pulse: 84 (Regular)  BP: 128/90 (Sitting, Right Arm, Standard)  Physical Exam  General Mental Status -Alert, cooperative and good historian. General Appearance-pleasant, Not in acute distress. Orientation-Oriented X3. Build & Nutrition-Well nourished and Well developed.  Head and Neck Head-normocephalic, atraumatic . Neck Global Assessment - supple, no bruit auscultated on the right, no bruit auscultated on the left.  Eye Vision-Wears corrective lenses(mostly reading). Pupil - Bilateral-Regular and Round. Motion - Bilateral-EOMI.  ENMT Note: lower partial denture plate   Chest and Lung Exam Auscultation Breath sounds - clear at anterior chest wall and clear at posterior chest wall. Adventitious sounds - No Adventitious sounds.  Cardiovascular Auscultation Rhythm - Regular rate and rhythm. Heart Sounds - S1 WNL and S2 WNL. Murmurs & Other Heart Sounds - Auscultation of the heart reveals - No Murmurs.  Abdomen Palpation/Percussion Tenderness - Abdomen is non-tender to palpation. Rigidity (guarding) - Abdomen is soft. Auscultation Auscultation of the abdomen reveals - Bowel sounds normal.  Female Genitourinary Note: Not done, not pertinent to present illness   Musculoskeletal Note: On exam, she is a well-developed female, alert and oriented, in no  apparent distress. Evaluation of her hips show normal range of motion with no discomfort. Her left knee shows no effusion. Range of motion of the left knee is 0 to 130. There is no tenderness or instability. Her right knee shows no effusion. There is varus deformity, range 5 to 130, moderate crepitus on range of motion, tenderness medial greater than lateral, no instability noted. Pulse, sensation, motor are intact, right lower extremity. She has significantly antalgic gait pattern on the right.  Her radiographs are reviewed from last visit, AP both knees and lateral of the right showing bone-on-bone arthritis in the medial and patellofemoral compartments of the right knee.  Assessment & Plan Primary osteoarthritis of right knee (M17.11)  Note:Surgical Plans: Right Total Knee Replacement  Disposition: Home with Husband, In-Home VERA following discharge from the hospital  PCP: Dr. Evelena PeatBruce Burchette - Pending  IV TXA  Anesthesia Issues: None  Patient was instructed on what medications to stop prior to surgery.  Signed electronically by Lauraine RinneAlexzandrew L Perkins, III PA-C

## 2016-11-23 NOTE — Progress Notes (Signed)
Pt aware of surgical time change for Monday 11/26/2016. Pt aware to [facility] at 8:30 am; pt verbalized understanding of no food or drink after midnight.

## 2016-11-26 ENCOUNTER — Inpatient Hospital Stay (HOSPITAL_COMMUNITY): Payer: Medicare Other | Admitting: Anesthesiology

## 2016-11-26 ENCOUNTER — Encounter (HOSPITAL_COMMUNITY)
Admission: RE | Disposition: A | Discharge status: Home / Self Care | Payer: Self-pay | Source: Ambulatory Visit | Attending: Orthopedic Surgery

## 2016-11-26 ENCOUNTER — Inpatient Hospital Stay (HOSPITAL_COMMUNITY)
Admission: RE | Admit: 2016-11-26 | Discharge: 2016-11-28 | DRG: 470 | Disposition: A | Discharge status: Home / Self Care | Payer: Medicare Other | Source: Ambulatory Visit | Attending: Orthopedic Surgery | Admitting: Orthopedic Surgery

## 2016-11-26 ENCOUNTER — Encounter (HOSPITAL_COMMUNITY): Payer: Self-pay | Admitting: *Deleted

## 2016-11-26 DIAGNOSIS — Z833 Family history of diabetes mellitus: Secondary | ICD-10-CM

## 2016-11-26 DIAGNOSIS — M1710 Unilateral primary osteoarthritis, unspecified knee: Secondary | ICD-10-CM | POA: Diagnosis present

## 2016-11-26 DIAGNOSIS — Z79899 Other long term (current) drug therapy: Secondary | ICD-10-CM

## 2016-11-26 DIAGNOSIS — M25761 Osteophyte, right knee: Secondary | ICD-10-CM | POA: Diagnosis present

## 2016-11-26 DIAGNOSIS — M25561 Pain in right knee: Secondary | ICD-10-CM | POA: Diagnosis present

## 2016-11-26 DIAGNOSIS — M1711 Unilateral primary osteoarthritis, right knee: Principal | ICD-10-CM | POA: Diagnosis present

## 2016-11-26 DIAGNOSIS — M179 Osteoarthritis of knee, unspecified: Secondary | ICD-10-CM | POA: Diagnosis present

## 2016-11-26 DIAGNOSIS — Z7984 Long term (current) use of oral hypoglycemic drugs: Secondary | ICD-10-CM | POA: Diagnosis not present

## 2016-11-26 DIAGNOSIS — H409 Unspecified glaucoma: Secondary | ICD-10-CM | POA: Diagnosis present

## 2016-11-26 DIAGNOSIS — Z7982 Long term (current) use of aspirin: Secondary | ICD-10-CM

## 2016-11-26 DIAGNOSIS — E118 Type 2 diabetes mellitus with unspecified complications: Secondary | ICD-10-CM | POA: Diagnosis present

## 2016-11-26 HISTORY — PX: TOTAL KNEE ARTHROPLASTY: SHX125

## 2016-11-26 LAB — GLUCOSE, CAPILLARY
GLUCOSE-CAPILLARY: 126 mg/dL — AB (ref 65–99)
GLUCOSE-CAPILLARY: 206 mg/dL — AB (ref 65–99)
Glucose-Capillary: 139 mg/dL — ABNORMAL HIGH (ref 65–99)
Glucose-Capillary: 156 mg/dL — ABNORMAL HIGH (ref 65–99)

## 2016-11-26 LAB — TYPE AND SCREEN
ABO/RH(D): A POS
ANTIBODY SCREEN: NEGATIVE

## 2016-11-26 SURGERY — ARTHROPLASTY, KNEE, TOTAL
Anesthesia: Spinal | Site: Knee | Laterality: Right

## 2016-11-26 MED ORDER — FENTANYL CITRATE (PF) 100 MCG/2ML IJ SOLN: 25.0000 ug | INTRAMUSCULAR | Status: DC | PRN

## 2016-11-26 MED ORDER — PROMETHAZINE HCL 25 MG/ML IJ SOLN: 6.2500 mg | INTRAMUSCULAR | Status: DC | PRN

## 2016-11-26 MED ORDER — LACTATED RINGERS IV SOLN
INTRAVENOUS | Status: DC
  Administered 2016-11-26 (×2): via INTRAVENOUS

## 2016-11-26 MED ORDER — MIDAZOLAM HCL 5 MG/5ML IJ SOLN
INTRAMUSCULAR | Status: DC | PRN
  Administered 2016-11-26: 2 mg via INTRAVENOUS

## 2016-11-26 MED ORDER — METHOCARBAMOL 500 MG PO TABS: 500.0000 mg | ORAL_TABLET | Freq: Four times a day (QID) | ORAL | Status: DC | PRN

## 2016-11-26 MED ORDER — ACETAMINOPHEN 500 MG PO TABS
1000.0000 mg | ORAL_TABLET | Freq: Four times a day (QID) | ORAL | Status: AC
  Administered 2016-11-26 – 2016-11-27 (×4): 1000 mg via ORAL
  Filled 2016-11-26 (×4): qty 2

## 2016-11-26 MED ORDER — ONDANSETRON HCL 4 MG/2ML IJ SOLN
INTRAMUSCULAR | Status: DC | PRN
Start: 2016-11-26 — End: 2016-11-26
  Administered 2016-11-26: 4 mg via INTRAVENOUS

## 2016-11-26 MED ORDER — POLYETHYLENE GLYCOL 3350 17 G PO PACK: 17.0000 g | PACK | Freq: Every day | ORAL | Status: DC | PRN

## 2016-11-26 MED ORDER — ACETAMINOPHEN 10 MG/ML IV SOLN
1000.0000 mg | Freq: Once | INTRAVENOUS | Status: AC
  Administered 2016-11-26: 1000 mg via INTRAVENOUS

## 2016-11-26 MED ORDER — RIVAROXABAN 10 MG PO TABS
10.0000 mg | ORAL_TABLET | Freq: Every day | ORAL | Status: DC
  Administered 2016-11-27 – 2016-11-28 (×2): 10 mg via ORAL
  Filled 2016-11-26 (×2): qty 1

## 2016-11-26 MED ORDER — DEXAMETHASONE SODIUM PHOSPHATE 10 MG/ML IJ SOLN
10.0000 mg | Freq: Once | INTRAMUSCULAR | Status: AC
  Administered 2016-11-27: 10 mg via INTRAVENOUS
  Filled 2016-11-26: qty 1

## 2016-11-26 MED ORDER — BRIMONIDINE TARTRATE 0.2 % OP SOLN
1.0000 [drp] | Freq: Two times a day (BID) | OPHTHALMIC | Status: DC
  Administered 2016-11-26 – 2016-11-28 (×4): 1 [drp] via OPHTHALMIC
  Filled 2016-11-26: qty 5

## 2016-11-26 MED ORDER — FENTANYL CITRATE (PF) 100 MCG/2ML IJ SOLN
100.0000 ug | Freq: Once | INTRAMUSCULAR | Status: AC
  Administered 2016-11-26: 75 ug via INTRAVENOUS

## 2016-11-26 MED ORDER — PROPOFOL 10 MG/ML IV BOLUS
INTRAVENOUS | Status: AC
  Filled 2016-11-26: qty 40

## 2016-11-26 MED ORDER — OXYCODONE HCL 5 MG PO TABS
5.0000 mg | ORAL_TABLET | ORAL | Status: DC | PRN
  Administered 2016-11-26 – 2016-11-27 (×5): 10 mg via ORAL
  Administered 2016-11-27: 5 mg via ORAL
  Administered 2016-11-28 (×5): 10 mg via ORAL
  Filled 2016-11-26 (×3): qty 2
  Filled 2016-11-26: qty 1
  Filled 2016-11-26 (×7): qty 2

## 2016-11-26 MED ORDER — FENTANYL CITRATE (PF) 100 MCG/2ML IJ SOLN
INTRAMUSCULAR | Status: AC
  Filled 2016-11-26: qty 2

## 2016-11-26 MED ORDER — PROPOFOL 500 MG/50ML IV EMUL
INTRAVENOUS | Status: DC | PRN
  Administered 2016-11-26: 30 ug via INTRAVENOUS

## 2016-11-26 MED ORDER — CEFAZOLIN SODIUM-DEXTROSE 2-4 GM/100ML-% IV SOLN
2.0000 g | INTRAVENOUS | Status: AC
  Administered 2016-11-26: 2 g via INTRAVENOUS

## 2016-11-26 MED ORDER — CEFAZOLIN SODIUM-DEXTROSE 2-4 GM/100ML-% IV SOLN
INTRAVENOUS | Status: AC
  Filled 2016-11-26: qty 100

## 2016-11-26 MED ORDER — SUGAMMADEX SODIUM 200 MG/2ML IV SOLN
INTRAVENOUS | Status: AC
  Filled 2016-11-26: qty 2

## 2016-11-26 MED ORDER — TRANEXAMIC ACID 1000 MG/10ML IV SOLN
1000.0000 mg | INTRAVENOUS | Status: AC
  Administered 2016-11-26: 1000 mg via INTRAVENOUS
  Filled 2016-11-26: qty 1100

## 2016-11-26 MED ORDER — ACETAMINOPHEN 10 MG/ML IV SOLN
INTRAVENOUS | Status: AC
  Filled 2016-11-26: qty 100

## 2016-11-26 MED ORDER — SODIUM CHLORIDE 0.9 % IV SOLN
INTRAVENOUS | Status: DC
  Administered 2016-11-26: 19:00:00 via INTRAVENOUS

## 2016-11-26 MED ORDER — ACETAMINOPHEN 325 MG PO TABS: 650.0000 mg | ORAL_TABLET | Freq: Four times a day (QID) | ORAL | Status: DC | PRN

## 2016-11-26 MED ORDER — FLEET ENEMA 7-19 GM/118ML RE ENEM: 1.0000 | ENEMA | Freq: Once | RECTAL | Status: DC | PRN

## 2016-11-26 MED ORDER — MIDAZOLAM HCL 2 MG/2ML IJ SOLN
INTRAMUSCULAR | Status: AC
  Filled 2016-11-26: qty 2

## 2016-11-26 MED ORDER — PHENYLEPHRINE 40 MCG/ML (10ML) SYRINGE FOR IV PUSH (FOR BLOOD PRESSURE SUPPORT)
PREFILLED_SYRINGE | INTRAVENOUS | Status: AC
  Filled 2016-11-26: qty 10

## 2016-11-26 MED ORDER — PRAVASTATIN SODIUM 20 MG PO TABS
20.0000 mg | ORAL_TABLET | Freq: Every day | ORAL | Status: DC
  Administered 2016-11-27 – 2016-11-28 (×2): 20 mg via ORAL
  Filled 2016-11-26 (×2): qty 1

## 2016-11-26 MED ORDER — MEPERIDINE HCL 50 MG/ML IJ SOLN: 6.2500 mg | INTRAMUSCULAR | Status: DC | PRN

## 2016-11-26 MED ORDER — METHOCARBAMOL 1000 MG/10ML IJ SOLN
500.0000 mg | Freq: Four times a day (QID) | INTRAVENOUS | Status: DC | PRN
  Filled 2016-11-26: qty 5

## 2016-11-26 MED ORDER — PHENOL 1.4 % MT LIQD
1.0000 | OROMUCOSAL | Status: DC | PRN
  Filled 2016-11-26: qty 177

## 2016-11-26 MED ORDER — ROCURONIUM BROMIDE 50 MG/5ML IV SOSY
PREFILLED_SYRINGE | INTRAVENOUS | Status: AC
  Filled 2016-11-26: qty 5

## 2016-11-26 MED ORDER — PROPOFOL 500 MG/50ML IV EMUL
INTRAVENOUS | Status: DC | PRN
  Administered 2016-11-26: 30 ug/kg/min via INTRAVENOUS

## 2016-11-26 MED ORDER — BUPIVACAINE IN DEXTROSE 0.75-8.25 % IT SOLN
INTRATHECAL | Status: DC | PRN
  Administered 2016-11-26: 1.6 mL via INTRATHECAL

## 2016-11-26 MED ORDER — DEXAMETHASONE SODIUM PHOSPHATE 10 MG/ML IJ SOLN
10.0000 mg | Freq: Once | INTRAMUSCULAR | Status: AC
  Administered 2016-11-26: 10 mg via INTRAVENOUS

## 2016-11-26 MED ORDER — BRIMONIDINE TARTRATE-TIMOLOL 0.2-0.5 % OP SOLN
1.0000 [drp] | Freq: Two times a day (BID) | OPHTHALMIC | Status: DC
  Filled 2016-11-26: qty 5

## 2016-11-26 MED ORDER — CEFAZOLIN SODIUM-DEXTROSE 2-4 GM/100ML-% IV SOLN
2.0000 g | Freq: Four times a day (QID) | INTRAVENOUS | Status: AC
  Administered 2016-11-26 – 2016-11-27 (×2): 2 g via INTRAVENOUS
  Filled 2016-11-26 (×2): qty 100

## 2016-11-26 MED ORDER — ONDANSETRON HCL 4 MG PO TABS: 4.0000 mg | ORAL_TABLET | Freq: Four times a day (QID) | ORAL | Status: DC | PRN

## 2016-11-26 MED ORDER — ONDANSETRON HCL 4 MG/2ML IJ SOLN: 4.0000 mg | Freq: Four times a day (QID) | INTRAMUSCULAR | Status: DC | PRN

## 2016-11-26 MED ORDER — METOCLOPRAMIDE HCL 5 MG/ML IJ SOLN: 5.0000 mg | Freq: Three times a day (TID) | INTRAMUSCULAR | Status: DC | PRN

## 2016-11-26 MED ORDER — GLYCOPYRROLATE 0.2 MG/ML IJ SOLN
INTRAMUSCULAR | Status: DC | PRN
  Administered 2016-11-26: 0.1 mg via INTRAVENOUS

## 2016-11-26 MED ORDER — INSULIN ASPART 100 UNIT/ML ~~LOC~~ SOLN
0.0000 [IU] | Freq: Three times a day (TID) | SUBCUTANEOUS | Status: DC
  Administered 2016-11-26: 5 [IU] via SUBCUTANEOUS
  Administered 2016-11-27 – 2016-11-28 (×4): 2 [IU] via SUBCUTANEOUS

## 2016-11-26 MED ORDER — DOCUSATE SODIUM 100 MG PO CAPS
100.0000 mg | ORAL_CAPSULE | Freq: Two times a day (BID) | ORAL | Status: DC
  Administered 2016-11-26 – 2016-11-28 (×4): 100 mg via ORAL
  Filled 2016-11-26 (×4): qty 1

## 2016-11-26 MED ORDER — BISACODYL 10 MG RE SUPP: 10.0000 mg | Freq: Every day | RECTAL | Status: DC | PRN

## 2016-11-26 MED ORDER — DIPHENHYDRAMINE HCL 12.5 MG/5ML PO ELIX: 12.5000 mg | ORAL_SOLUTION | ORAL | Status: DC | PRN

## 2016-11-26 MED ORDER — MENTHOL 3 MG MT LOZG: 1.0000 | LOZENGE | OROMUCOSAL | Status: DC | PRN

## 2016-11-26 MED ORDER — SODIUM CHLORIDE 0.9 % IJ SOLN
INTRAMUSCULAR | Status: DC | PRN
  Administered 2016-11-26: 30 mL

## 2016-11-26 MED ORDER — SODIUM CHLORIDE 0.9 % IV SOLN
1000.0000 mg | Freq: Once | INTRAVENOUS | Status: AC
  Administered 2016-11-26: 1000 mg via INTRAVENOUS
  Filled 2016-11-26: qty 1100

## 2016-11-26 MED ORDER — TIMOLOL MALEATE 0.5 % OP SOLN
1.0000 [drp] | Freq: Two times a day (BID) | OPHTHALMIC | Status: DC
  Administered 2016-11-26 – 2016-11-28 (×4): 1 [drp] via OPHTHALMIC
  Filled 2016-11-26: qty 5

## 2016-11-26 MED ORDER — FENTANYL CITRATE (PF) 100 MCG/2ML IJ SOLN
INTRAMUSCULAR | Status: DC | PRN
  Administered 2016-11-26: 25 ug via INTRAVENOUS

## 2016-11-26 MED ORDER — LIDOCAINE HCL (CARDIAC) 20 MG/ML IV SOLN
INTRAVENOUS | Status: DC | PRN
  Administered 2016-11-26: 50 mg via INTRAVENOUS

## 2016-11-26 MED ORDER — BUPIVACAINE LIPOSOME 1.3 % IJ SUSP
INTRAMUSCULAR | Status: DC | PRN
  Administered 2016-11-26: 20 mL

## 2016-11-26 MED ORDER — DEXAMETHASONE SODIUM PHOSPHATE 10 MG/ML IJ SOLN
INTRAMUSCULAR | Status: AC
  Filled 2016-11-26: qty 1

## 2016-11-26 MED ORDER — BUPIVACAINE LIPOSOME 1.3 % IJ SUSP
20.0000 mL | Freq: Once | INTRAMUSCULAR | Status: DC
  Filled 2016-11-26: qty 20

## 2016-11-26 MED ORDER — ONDANSETRON HCL 4 MG/2ML IJ SOLN
INTRAMUSCULAR | Status: AC
  Filled 2016-11-26: qty 2

## 2016-11-26 MED ORDER — METOCLOPRAMIDE HCL 5 MG PO TABS: 5.0000 mg | ORAL_TABLET | Freq: Three times a day (TID) | ORAL | Status: DC | PRN

## 2016-11-26 MED ORDER — TRAMADOL HCL 50 MG PO TABS
50.0000 mg | ORAL_TABLET | Freq: Four times a day (QID) | ORAL | Status: DC | PRN
Start: 2016-11-26 — End: 2016-11-28
  Administered 2016-11-28: 100 mg via ORAL
  Filled 2016-11-26: qty 2

## 2016-11-26 MED ORDER — SODIUM CHLORIDE 0.9 % IJ SOLN
INTRAMUSCULAR | Status: AC
  Filled 2016-11-26: qty 50

## 2016-11-26 MED ORDER — CHLORHEXIDINE GLUCONATE 4 % EX LIQD: 60.0000 mL | Freq: Once | CUTANEOUS | Status: DC

## 2016-11-26 MED ORDER — BUPIVACAINE-EPINEPHRINE (PF) 0.5% -1:200000 IJ SOLN
INTRAMUSCULAR | Status: DC | PRN
  Administered 2016-11-26: 30 mL via PERINEURAL

## 2016-11-26 MED ORDER — ACETAMINOPHEN 650 MG RE SUPP: 650.0000 mg | Freq: Four times a day (QID) | RECTAL | Status: DC | PRN

## 2016-11-26 MED ORDER — MORPHINE SULFATE (PF) 4 MG/ML IV SOLN: 1.0000 mg | INTRAVENOUS | Status: DC | PRN

## 2016-11-26 SURGICAL SUPPLY — 50 items
BAG DECANTER FOR FLEXI CONT (MISCELLANEOUS) ×2 IMPLANT
BAG ZIPLOCK 12X15 (MISCELLANEOUS) ×2 IMPLANT
BANDAGE ACE 6X5 VEL STRL LF (GAUZE/BANDAGES/DRESSINGS) ×2 IMPLANT
BANDAGE ELASTIC 6 VELCRO ST LF (GAUZE/BANDAGES/DRESSINGS) ×2 IMPLANT
BLADE SAG 18X100X1.27 (BLADE) ×2 IMPLANT
BLADE SAW SGTL 11.0X1.19X90.0M (BLADE) ×2 IMPLANT
BOWL SMART MIX CTS (DISPOSABLE) ×2 IMPLANT
CAP KNEE TOTAL 3 SIGMA ×2 IMPLANT
CEMENT HV SMART SET (Cement) ×4 IMPLANT
CUFF TOURN SGL QUICK 34 (TOURNIQUET CUFF) ×1
CUFF TRNQT CYL 34X4X40X1 (TOURNIQUET CUFF) ×1 IMPLANT
DECANTER SPIKE VIAL GLASS SM (MISCELLANEOUS) ×2 IMPLANT
DRAPE U-SHAPE 47X51 STRL (DRAPES) ×2 IMPLANT
DRSG ADAPTIC 3X8 NADH LF (GAUZE/BANDAGES/DRESSINGS) ×2 IMPLANT
DRSG PAD ABDOMINAL 8X10 ST (GAUZE/BANDAGES/DRESSINGS) ×2 IMPLANT
DURAPREP 26ML APPLICATOR (WOUND CARE) ×2 IMPLANT
ELECT REM PT RETURN 15FT ADLT (MISCELLANEOUS) ×2 IMPLANT
EVACUATOR 1/8 PVC DRAIN (DRAIN) ×2 IMPLANT
GAUZE SPONGE 4X4 12PLY STRL (GAUZE/BANDAGES/DRESSINGS) ×2 IMPLANT
GLOVE BIO SURGEON STRL SZ7.5 (GLOVE) IMPLANT
GLOVE BIO SURGEON STRL SZ8 (GLOVE) ×2 IMPLANT
GLOVE BIOGEL PI IND STRL 6.5 (GLOVE) IMPLANT
GLOVE BIOGEL PI IND STRL 8 (GLOVE) ×1 IMPLANT
GLOVE BIOGEL PI INDICATOR 6.5 (GLOVE)
GLOVE BIOGEL PI INDICATOR 8 (GLOVE) ×1
GLOVE SURG SS PI 6.5 STRL IVOR (GLOVE) IMPLANT
GOWN STRL REUS W/TWL LRG LVL3 (GOWN DISPOSABLE) ×2 IMPLANT
GOWN STRL REUS W/TWL XL LVL3 (GOWN DISPOSABLE) IMPLANT
HANDPIECE INTERPULSE COAX TIP (DISPOSABLE) ×1
IMMOBILIZER KNEE 20 (SOFTGOODS) ×2
IMMOBILIZER KNEE 20 THIGH 36 (SOFTGOODS) ×1 IMPLANT
MANIFOLD NEPTUNE II (INSTRUMENTS) ×2 IMPLANT
NS IRRIG 1000ML POUR BTL (IV SOLUTION) ×2 IMPLANT
PACK TOTAL KNEE CUSTOM (KITS) ×2 IMPLANT
PAD ABD 8X10 STRL (GAUZE/BANDAGES/DRESSINGS) ×2 IMPLANT
PADDING CAST COTTON 6X4 STRL (CAST SUPPLIES) ×2 IMPLANT
POSITIONER SURGICAL ARM (MISCELLANEOUS) ×2 IMPLANT
SET HNDPC FAN SPRY TIP SCT (DISPOSABLE) ×1 IMPLANT
STRIP CLOSURE SKIN 1/2X4 (GAUZE/BANDAGES/DRESSINGS) ×2 IMPLANT
SUT MNCRL AB 4-0 PS2 18 (SUTURE) ×2 IMPLANT
SUT STRATAFIX 0 PDS 27 VIOLET (SUTURE) ×2
SUT VIC AB 2-0 CT1 27 (SUTURE) ×3
SUT VIC AB 2-0 CT1 TAPERPNT 27 (SUTURE) ×3 IMPLANT
SUTURE STRATFX 0 PDS 27 VIOLET (SUTURE) ×1 IMPLANT
SYR 50ML LL SCALE MARK (SYRINGE) IMPLANT
TRAY FOLEY W/METER SILVER 14FR (SET/KITS/TRAYS/PACK) ×2 IMPLANT
TRAY FOLEY W/METER SILVER 16FR (SET/KITS/TRAYS/PACK) IMPLANT
WATER STERILE IRR 1000ML POUR (IV SOLUTION) ×4 IMPLANT
WRAP KNEE MAXI GEL POST OP (GAUZE/BANDAGES/DRESSINGS) ×2 IMPLANT
YANKAUER SUCT BULB TIP 10FT TU (MISCELLANEOUS) ×2 IMPLANT

## 2016-11-26 NOTE — Anesthesia Procedure Notes (Signed)
Spinal  Start time: 11/26/2016 10:42 AM End time: 11/26/2016 10:46 AM Staffing Resident/CRNA: Durward ParcelFLYNN-COOK, Delmont Prosch A Performed: resident/CRNA  Preanesthetic Checklist Completed: patient identified, site marked, surgical consent, pre-op evaluation, timeout performed, IV checked, risks and benefits discussed and monitors and equipment checked Spinal Block Patient position: sitting Prep: Betadine Patient monitoring: cardiac monitor, continuous pulse ox and blood pressure Approach: midline Location: L3-4 Injection technique: single-shot Needle Needle type: Sprotte  Needle gauge: 24 G Needle length: 9 cm Needle insertion depth: 5 cm Assessment Sensory level: T8

## 2016-11-26 NOTE — Anesthesia Procedure Notes (Addendum)
Anesthesia Regional Block: Adductor canal block   Pre-Anesthetic Checklist: ,, timeout performed, Correct Patient, Correct Site, Correct Laterality, Correct Procedure, Correct Position, site marked, Risks and benefits discussed,  Surgical consent,  Pre-op evaluation,  At surgeon's request and post-op pain management  Laterality: Right  Prep: chloraprep       Needles:   Needle Type: Echogenic Stimulator Needle     Needle Length: 9cm  Needle Gauge: 21     Additional Needles:   Procedures: ultrasound guided,,,,,,,,  Narrative:  Start time: 11/26/2016 10:14 AM End time: 11/26/2016 10:19 AM Injection made incrementally with aspirations every 5 mL.  Performed by: Personally  Anesthesiologist: Mal AmabileFOSTER, Lenni Reckner  Additional Notes: Timeout performed. Patient sedated. Relevant anatomy ID'd using US. Incremental 5ml injection with frequent aspiration. Patient tolerated procedure well.

## 2016-11-26 NOTE — Op Note (Signed)
OPERATIVE REPORT-TOTAL KNEE ARTHROPLASTY   Pre-operative diagnosis- Osteoarthritis  Right knee(s)  Post-operative diagnosis- Osteoarthritis Right knee(s)  Procedure-  Right  Total Knee Arthroplasty  Surgeon- Gus RankinFrank V. Matsuko Kretz, MD  Assistant- Avel Peacerew Perkins, PA-C   Anesthesia-  Adductor canal block and spinal  EBL-* No blood loss amount entered *   Drains Hemovac  Tourniquet time-  Total Tourniquet Time Documented: Thigh (Right) - 24 minutes Total: Thigh (Right) - 24 minutes     Complications- None  Condition-PACU - hemodynamically stable.   Brief Clinical Note  Kelly Blake is a 76 y.o. year old female with end stage OA of her right knee with progressively worsening pain and dysfunction. She has constant pain, with activity and at rest and significant functional deficits with difficulties even with ADLs. She has had extensive non-op management including analgesics, injections of cortisone and viscosupplements, and home exercise program, but remains in significant pain with significant dysfunction.Radiographs show bone on bone arthritis medial and patellofemoral. She presents now for right Total Knee Arthroplasty.    Procedure in detail---   The patient is brought into the operating room and positioned supine on the operating table. After successful administration of  Adductor canal block and spinal,   a tourniquet is placed high on the  Right thigh(s) and the lower extremity is prepped and draped in the usual sterile fashion. Time out is performed by the operating team and then the  Right lower extremity is wrapped in Esmarch, knee flexed and the tourniquet inflated to 300 mmHg.       A midline incision is made with a ten blade through the subcutaneous tissue to the level of the extensor mechanism. A fresh blade is used to make a medial parapatellar arthrotomy. Soft tissue over the proximal medial tibia is subperiosteally elevated to the joint line with a knife and into the  semimembranosus bursa with a Cobb elevator. Soft tissue over the proximal lateral tibia is elevated with attention being paid to avoiding the patellar tendon on the tibial tubercle. The patella is everted, knee flexed 90 degrees and the ACL and PCL are removed. Findings are bone on bone medial and patellofemoral with large global osteophytes.        The drill is used to create a starting hole in the distal femur and the canal is thoroughly irrigated with sterile saline to remove the fatty contents. The 5 degree Right  valgus alignment guide is placed into the femoral canal and the distal femoral cutting block is pinned to remove 10 mm off the distal femur. Resection is made with an oscillating saw.      The tibia is subluxed forward and the menisci are removed. The extramedullary alignment guide is placed referencing proximally at the medial aspect of the tibial tubercle and distally along the second metatarsal axis and tibial crest. The block is pinned to remove 2mm off the more deficient medial  side. Resection is made with an oscillating saw. Size 2.5is the most appropriate size for the tibia and the proximal tibia is prepared with the modular drill and keel punch for that size.      The femoral sizing guide is placed and size 3 is most appropriate. Rotation is marked off the epicondylar axis and confirmed by creating a rectangular flexion gap at 90 degrees. The size 3 cutting block is pinned in this rotation and the anterior, posterior and chamfer cuts are made with the oscillating saw. The intercondylar block is then placed and that  cut is made.      Trial size 2.5 tibial component, trial size 3 posterior stabilized femur and a 10  mm posterior stabilized rotating platform insert trial is placed. Full extension is achieved with excellent varus/valgus and anterior/posterior balance throughout full range of motion. The patella is everted and thickness measured to be 22  mm. Free hand resection is taken to 12  mm, a 38 template is placed, lug holes are drilled, trial patella is placed, and it tracks normally. Osteophytes are removed off the posterior femur with the trial in place. All trials are removed and the cut bone surfaces prepared with pulsatile lavage. Cement is mixed and once ready for implantation, the size 2.5 tibial implant, size  3 posterior stabilized femoral component, and the size 38 patella are cemented in place and the patella is held with the clamp. The trial insert is placed and the knee held in full extension. The Exparel (20 ml mixed with 30 ml saline) is injected into the extensor mechanism, posterior capsule, medial and lateral gutters and subcutaneous tissues.  All extruded cement is removed and once the cement is hard the permanent 10 mm posterior stabilized rotating platform insert is placed into the tibial tray.      The wound is copiously irrigated with saline solution and the extensor mechanism closed over a hemovac drain with #1 V-loc suture. The tourniquet is released for a total tourniquet time of 24  minutes. Flexion against gravity is 140 degrees and the patella tracks normally. Subcutaneous tissue is closed with 2.0 vicryl and subcuticular with running 4.0 Monocryl. The incision is cleaned and dried and steri-strips and a bulky sterile dressing are applied. The limb is placed into a knee immobilizer and the patient is awakened and transported to recovery in stable condition.      Please note that a surgical assistant was a medical necessity for this procedure in order to perform it in a safe and expeditious manner. Surgical assistant was necessary to retract the ligaments and vital neurovascular structures to prevent injury to them and also necessary for proper positioning of the limb to allow for anatomic placement of the prosthesis.   Gus Rankin Addeline Calarco, MD    11/26/2016, 11:32 AM

## 2016-11-26 NOTE — Anesthesia Preprocedure Evaluation (Signed)
Anesthesia Evaluation  Patient identified by MRN, date of birth, ID band Patient awake    Reviewed: Allergy & Precautions, NPO status , Patient's Chart, lab work & pertinent test results  Airway Mallampati: II  TM Distance: >3 FB Neck ROM: Full    Dental no notable dental hx. (+) Teeth Intact   Pulmonary neg pulmonary ROS,    Pulmonary exam normal breath sounds clear to auscultation       Cardiovascular hypertension, Normal cardiovascular exam Rhythm:Regular Rate:Normal     Neuro/Psych negative neurological ROS  negative psych ROS   GI/Hepatic negative GI ROS, Neg liver ROS,   Endo/Other  diabetes, Well Controlled, Type 2, Oral Hypoglycemic AgentsHyperlipidemia  Renal/GU Renal InsufficiencyRenal disease  negative genitourinary   Musculoskeletal  (+) Arthritis , Osteoarthritis,    Abdominal   Peds  Hematology negative hematology ROS (+)   Anesthesia Other Findings   Reproductive/Obstetrics                             Anesthesia Physical Anesthesia Plan  ASA: II  Anesthesia Plan: Spinal   Post-op Pain Management:  Regional for Post-op pain   Induction:   Airway Management Planned:   Additional Equipment:   Intra-op Plan:   Post-operative Plan:   Informed Consent: I have reviewed the patients History and Physical, chart, labs and discussed the procedure including the risks, benefits and alternatives for the proposed anesthesia with the patient or authorized representative who has indicated his/her understanding and acceptance.     Plan Discussed with: Anesthesiologist, CRNA and Surgeon  Anesthesia Plan Comments:         Anesthesia Quick Evaluation

## 2016-11-26 NOTE — H&P (View-Only) (Signed)
Kelly Blake DOB: [birth year] Married / Language: [place] / Race: White Female Date of Admission:  11/26/2016 CC:  Right Knee Pain History of Present Illness The patient is a 76 year old female who comes in today for a preoperative History and Physical. The patient is scheduled for a right total knee arthroplasty to be performed by Dr. Gus Rankin. Aluisio, MD at [facility] on 11-26-2016. The patient is a 76 year old female who presented for follow up of their knee. The patient is being followed for their right knee pain and osteoarthritis. They state that her symptoms are slightly worse. Symptoms reported include: pain, swelling and difficulty ambulating (walks with a limp). The following medication has been used for pain control: none. She had previous cortisone and viscosupplement injections in her right knee. She was having pain and dysfunction. She said in the past six to seven months things have gotten worse. Pain is present as expected, but her dysfunction has gotten a little worse. She is at a stage now where she is ready to get the knee replacement. They have been treated conservatively in the past for the above stated problem and despite conservative measures, they continue to have progressive pain and severe functional limitations and dysfunction. They have failed non-operative management including home exercise, medications, and injections. It is felt that they would benefit from undergoing total joint replacement. Risks and benefits of the procedure have been discussed with the patient and they elect to proceed with surgery. There are no active contraindications to surgery such as ongoing infection or rapidly progressive neurological disease.  Past Medical History Diabetes Mellitus, Type II  Diverticulitis Of Colon  Primary localized osteoarthritis of right knee (M17.11)  Glaucoma  Measles  Mumps  Menopause  Allergies  No Known Drug Allergies   Family History   Cancer  mother Cerebrovascular Accident  father and grandmother mothers side Diabetes Mellitus  father and grandfather fathers side  Social History Alcohol use  current drinker; drinks wine and hard liquor; 5-7 per week Children  3 Current work status  retired Financial planner (Currently)  no Drug/Alcohol Rehab (Previously)  no Exercise  Exercises weekly; does gym / weights Illicit drug use  no Living situation  live with spouse Marital status  married Number of flights of stairs before winded  2-3 Pain Contract  no Tobacco / smoke exposure  no Tobacco use  never smoker 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 Vitamin B12 (Oral) Specific strength unknown - Active. Vitamin D3 (Oral) Specific strength unknown - Active. Pravastatin Sodium (20MG  Tablet, Oral) Active. MetFORMIN HCl (500MG  Tablet, Oral) Active. Zinc (50MG  Tablet, Oral) Active. Combigan (0.2-0.5% Solution, Ophthalmic) Active.  Past Surgical History Dilation and Curettage of Uterus  Right Knee Meniscal Surgery  Date: 2003.  Review of Systems  General Not Present- Chills, Fatigue, Fever, Memory Loss, Night Sweats, Weight Gain and Weight Loss. Skin Not Present- Eczema, Hives, Itching, Lesions and Rash. HEENT Not Present- Dentures, Double Vision, Headache, Hearing Loss, Tinnitus and Visual Loss. Respiratory Not Present- Allergies, Chronic Cough, Coughing up blood, Shortness of breath at rest and Shortness of breath with exertion. Cardiovascular Not Present- Chest Pain, Difficulty Breathing Lying Down, Murmur, Palpitations, Racing/skipping heartbeats and Swelling. Gastrointestinal Not Present- Abdominal Pain, Bloody Stool, Constipation, Diarrhea, Difficulty Swallowing, Heartburn, Jaundice, Loss of appetitie, Nausea and Vomiting. Female Genitourinary Present- Urinating at Night. Not  Present- Blood in Urine,  Discharge, Flank Pain, Incontinence, Painful Urination, Urgency, Urinary frequency, Urinary Retention and Weak urinary stream. Musculoskeletal Present- Joint Pain and Morning Stiffness. Not Present- Back Pain, Joint Swelling, Muscle Pain, Muscle Weakness and Spasms. Neurological Not Present- Blackout spells, Difficulty with balance, Dizziness, Paralysis, Tremor and Weakness. Psychiatric Not Present- Insomnia.  Vitals Weight: 132 lb Height: 60in Weight was reported by patient. Height was reported by patient. Body Surface Area: 1.56 m Body Mass Index: 25.78 kg/m  Pulse: 84 (Regular)  BP: 128/90 (Sitting, Right Arm, Standard)  Physical Exam  General Mental Status -Alert, cooperative and good historian. General Appearance-pleasant, Not in acute distress. Orientation-Oriented X3. Build & Nutrition-Well nourished and Well developed.  Head and Neck Head-normocephalic, atraumatic . Neck Global Assessment - supple, no bruit auscultated on the right, no bruit auscultated on the left.  Eye Vision-Wears corrective lenses(mostly reading). Pupil - Bilateral-Regular and Round. Motion - Bilateral-EOMI.  ENMT Note: lower partial denture plate   Chest and Lung Exam Auscultation Breath sounds - clear at anterior chest wall and clear at posterior chest wall. Adventitious sounds - No Adventitious sounds.  Cardiovascular Auscultation Rhythm - Regular rate and rhythm. Heart Sounds - S1 WNL and S2 WNL. Murmurs & Other Heart Sounds - Auscultation of the heart reveals - No Murmurs.  Abdomen Palpation/Percussion Tenderness - Abdomen is non-tender to palpation. Rigidity (guarding) - Abdomen is soft. Auscultation Auscultation of the abdomen reveals - Bowel sounds normal.  Female Genitourinary Note: Not done, not pertinent to present illness   Musculoskeletal Note: On exam, she is a well-developed female, alert and oriented, in no  apparent distress. Evaluation of her hips show normal range of motion with no discomfort. Her left knee shows no effusion. Range of motion of the left knee is 0 to 130. There is no tenderness or instability. Her right knee shows no effusion. There is varus deformity, range 5 to 130, moderate crepitus on range of motion, tenderness medial greater than lateral, no instability noted. Pulse, sensation, motor are intact, right lower extremity. She has significantly antalgic gait pattern on the right.  Her radiographs are reviewed from last visit, AP both knees and lateral of the right showing bone-on-bone arthritis in the medial and patellofemoral compartments of the right knee.  Assessment & Plan Primary osteoarthritis of right knee (M17.11)  Note:Surgical Plans: Right Total Knee Replacement  Disposition: Home with Husband, In-Home VERA following discharge from the hospital  PCP: Dr. Evelena PeatBruce Burchette - Pending  IV TXA  Anesthesia Issues: None  Patient was instructed on what medications to stop prior to surgery.  Signed electronically by Lauraine RinneAlexzandrew L Perkins, III PA-C

## 2016-11-26 NOTE — Interval H&P Note (Signed)
History and Physical Interval Note:  11/26/2016 9:31 AM  Kelly Blake  has presented today for surgery, with the diagnosis of Osteoarthritis Right Knee  The various methods of treatment have been discussed with the patient and family. After consideration of risks, benefits and other options for treatment, the patient has consented to  Procedure(s): RIGHT TOTAL KNEE ARTHROPLASTY (Right) as a surgical intervention .  The patient's history has been reviewed, patient examined, no change in status, stable for surgery.  I have reviewed the patient's chart and labs.  Questions were answered to the patient's satisfaction.     Loanne DrillingALUISIO,Trestan Vahle V

## 2016-11-26 NOTE — Transfer of Care (Signed)
Immediate Anesthesia Transfer of Care Note  Patient: Kelly Blake  Procedure(s) Performed: Procedure(s): RIGHT TOTAL KNEE ARTHROPLASTY (Right)  Patient Location: PACU  Anesthesia Type:Spinal and MAC combined with regional for post-op pain  Level of Consciousness: awake, alert , oriented and patient cooperative  Airway & Oxygen Therapy: Patient Spontanous Breathing and Patient connected to face mask oxygen  Post-op Assessment: Report given to RN and Post -op Vital signs reviewed and stable  Post vital signs: Reviewed and stable  Last Vitals:  Vitals:   11/26/16 1022 11/26/16 1030  BP: (!) 158/84 (!) 158/75  Pulse: (!) 55 (!) 44  Resp: 14 11  Temp:      Last Pain:  Vitals:   11/26/16 0859  TempSrc: Oral      Patients Stated Pain Goal: 3 (07/62/26 3335)  Complications: No apparent anesthesia complications

## 2016-11-26 NOTE — Progress Notes (Signed)
AssistedDr. Foster with right, ultrasound guided, adductor canal block. Side rails up, monitors on throughout procedure. See vital signs in flow sheet. Tolerated Procedure well.  

## 2016-11-26 NOTE — Anesthesia Postprocedure Evaluation (Signed)
Anesthesia Post Note  Patient: Kelly Blake  Procedure(s) Performed: Procedure(s) (LRB): RIGHT TOTAL KNEE ARTHROPLASTY (Right)  Patient location during evaluation: PACU Anesthesia Type: Spinal Level of consciousness: oriented and awake and alert Pain management: pain level controlled Vital Signs Assessment: post-procedure vital signs reviewed and stable Respiratory status: spontaneous breathing, respiratory function stable and nonlabored ventilation Cardiovascular status: blood pressure returned to baseline and stable Postop Assessment: no headache and no backache Anesthetic complications: no       Last Vitals:  Vitals:   11/26/16 1245 11/26/16 1300  BP: 128/68 138/65  Pulse: (!) 56 (!) 57  Resp: (!) 9   Temp:      Last Pain:  Vitals:   11/26/16 1230  TempSrc:   PainSc: 0-No pain                 Rika Daughdrill A.

## 2016-11-27 LAB — BASIC METABOLIC PANEL
ANION GAP: 5 (ref 5–15)
BUN: 20 mg/dL (ref 6–20)
CALCIUM: 9.2 mg/dL (ref 8.9–10.3)
CO2: 26 mmol/L (ref 22–32)
Chloride: 108 mmol/L (ref 101–111)
Creatinine, Ser: 0.64 mg/dL (ref 0.44–1.00)
GFR calc non Af Amer: >60 mL/min (ref >60)
GLUCOSE: 166 mg/dL — AB (ref 65–99)
POTASSIUM: 4.4 mmol/L (ref 3.5–5.1)
Sodium: 139 mmol/L (ref 135–145)

## 2016-11-27 LAB — GLUCOSE, CAPILLARY
GLUCOSE-CAPILLARY: 128 mg/dL — AB (ref 65–99)
Glucose-Capillary: 143 mg/dL — ABNORMAL HIGH (ref 65–99)
Glucose-Capillary: 127 mg/dL — ABNORMAL HIGH (ref 65–99)
Glucose-Capillary: 152 mg/dL — ABNORMAL HIGH (ref 65–99)

## 2016-11-27 LAB — CBC
HEMATOCRIT: 30.4 % — AB (ref 36.0–46.0)
HEMOGLOBIN: 10.5 g/dL — AB (ref 12.0–15.0)
MCH: 31.5 pg (ref 26.0–34.0)
MCHC: 34.5 g/dL (ref 30.0–36.0)
MCV: 91.3 fL (ref 78.0–100.0)
Platelets: 238 10*3/uL (ref 150–400)
RBC: 3.33 MIL/uL — AB (ref 3.87–5.11)
RDW: 13.1 % (ref 11.5–15.5)
WBC: 10.2 10*3/uL (ref 4.0–10.5)

## 2016-11-27 MED ORDER — OXYCODONE HCL 5 MG PO TABS: 5.0000 mg | ORAL_TABLET | ORAL | 0 refills | Status: DC | PRN

## 2016-11-27 MED ORDER — METHOCARBAMOL 500 MG PO TABS: 500.0000 mg | ORAL_TABLET | Freq: Four times a day (QID) | ORAL | 0 refills | Status: DC | PRN

## 2016-11-27 MED ORDER — TRAMADOL HCL 50 MG PO TABS: 50.0000 mg | ORAL_TABLET | Freq: Four times a day (QID) | ORAL | 0 refills | Status: DC | PRN

## 2016-11-27 MED ORDER — RIVAROXABAN 10 MG PO TABS: 10.0000 mg | ORAL_TABLET | Freq: Every day | ORAL | 0 refills | Status: DC

## 2016-11-27 NOTE — Care Management Note (Signed)
Case Management Note  Patient Details  Name: Edla Para MRN: [id number] Date of Birth: 14-Apr-1941  Subjective/Objective:                  RIGHT TOTAL KNEE ARTHROPLASTY (Right) Action/Plan: Discharge planning Expected Discharge Date:  11/28/16               Expected Discharge Plan:  Home/Self Care  In-House Referral:     Discharge planning Services  CM Consult  Post Acute Care Choice:  NA Choice offered to:  Patient  DME Arranged:  3-N-1 DME Agency:  [place]:  NA [place] Agency:  NA  Status of Service:  Completed, signed off  If discussed at [place] of Stay Meetings, dates discussed:    Additional Comments: CM met with pt in room to confirm plan is for Virtual PT; pt confirms. CM notified [place] DME rep, Jermaine to please deliver the 3n1 to room prior to discharge. NO other CM needs were communicated. Dellie Catholic, RN 11/27/2016, 11:57 AM

## 2016-11-27 NOTE — Evaluation (Signed)
Occupational Therapy Evaluation Patient Details Name: Kelly Blake MRN: [id number] DOB: [birth year] Today's Date: 11/27/2016    History of Present Illness s/p R TKA   Clinical Impression   This 76 year old female was admitted for the above sx. All education was completed. No further OT is needed at this time    Follow Up Recommendations  No OT follow up;Supervision/Assistance - 24 hour    Equipment Recommendations  None recommended by OT    Recommendations for Other Services       Precautions / Restrictions Precautions Precautions: Knee Precaution Comments: performed SLR; used KI Required Braces or Orthoses: Knee Immobilizer - Right Restrictions Weight Bearing Restrictions: No      Mobility Bed Mobility Overal bed mobility: Needs Assistance Bed Mobility: Supine to Sit     Supine to sit: Supervision;HOB elevated        Transfers Overall transfer level: Needs assistance Equipment used: Rolling walker (2 wheeled) Transfers: Sit to/from UGI CorporationStand;Stand Pivot Transfers Sit to Stand: Min guard Stand pivot transfers: Min guard       General transfer comment: cues for UE/LE placement; pt tends to pull up on RW    Balance                                           ADL either performed or assessed with clinical judgement   ADL Overall ADL's : Needs assistance/impaired Eating/Feeding: Independent   Grooming: Set up;Supervision/safety;Standing;Oral care   Upper Body Bathing: Set up;Sitting   Lower Body Bathing: Minimal assistance;Sit to/from stand   Upper Body Dressing : Set up;Sitting   Lower Body Dressing: Moderate assistance;Sit to/from stand   Toilet Transfer: BSC;RW;Stand-pivot;Min guard   Toileting- ArchitectClothing Manipulation and Hygiene: Min guard;Sit to/from stand   Tub/ Shower Transfer: Walk-in shower;Min guard;Ambulation;3 in 1     General ADL Comments: used 3:1 commode at bedside due to urgency.  Ambulated to bathroom afterwards  and performed shower transfer and brushed teeth.  Pt does not have AE but husband can assist at home: she will mostly need assistance with R foot for sock and shoe. Reviewed precautions and safety     Vision         Perception     Praxis      Pertinent Vitals/Pain Pain Assessment: 0-10 Pain Score: 4  Pain Location: R knee Pain Descriptors / Indicators: Aching;Sore Pain Intervention(s): Limited activity within patient's tolerance;Monitored during session;Repositioned (did not want pain meds; took tylenol)     Hand Dominance     Extremity/Trunk Assessment Upper Extremity Assessment Upper Extremity Assessment: Overall WFL for tasks assessed           Communication Communication Communication: No difficulties   Cognition Arousal/Alertness: Awake/alert Behavior During Therapy: WFL for tasks assessed/performed Overall Cognitive Status: Within Functional Limits for tasks assessed                                     General Comments       Exercises     Shoulder Instructions      Home Living Family/patient expects to be discharged to:: Private residence Living Arrangements: Spouse/significant other                 Bathroom Shower/Tub: Producer, television/film/videoWalk-in shower   Bathroom Toilet: Standard  Home Equipment: Toilet riser;Shower seat          Prior Functioning/Environment Level of Independence: Independent                 OT Problem List:        OT Treatment/Interventions:      OT Goals(Current goals can be found in the care plan section) Acute Rehab OT Goals Patient Stated Goal: return to independence OT Goal Formulation: All assessment and education complete, DC therapy  OT Frequency:     Barriers to D/C:            Co-evaluation              End of Session    Activity Tolerance: Patient tolerated treatment well Patient left: in chair;with call bell/phone within reach  OT Visit Diagnosis: Pain Pain - Right/Left:  Right Pain - part of body: Knee                Time: 1610-9604 OT Time Calculation (min): 20 min Charges:  OT General Charges $OT Visit: 1 Procedure OT Evaluation $OT Eval Low Complexity: 1 Procedure G-Codes:     Marica Otter, OTR/L [phone] 11/27/2016  Kelly Blake 11/27/2016, 9:38 AM

## 2016-11-27 NOTE — Discharge Instructions (Addendum)
° ° °  ° °Dr. Frank Aluisio °Total Joint Specialist °[facility] °[address] °[place], [place] [address] °[phone] ° °TOTAL KNEE REPLACEMENT POSTOPERATIVE DIRECTIONS ° °Knee Rehabilitation, Guidelines Following Surgery  °Results after knee surgery are often greatly improved when you follow the exercise, range of motion and muscle strengthening exercises prescribed by your doctor. Safety measures are also important to protect the knee from further injury. Any time any of these exercises cause you to have increased pain or swelling in your knee joint, decrease the amount until you are comfortable again and slowly increase them. If you have problems or questions, call your caregiver or physical therapist for advice.  ° °HOME CARE INSTRUCTIONS  °Remove items at home which could result in a fall. This includes throw rugs or furniture in walking pathways.  °· ICE to the affected knee every three hours for 30 minutes at a time and then as needed for pain and swelling.  Continue to use ice on the knee for pain and swelling from surgery. You may notice swelling that will progress down to the foot and ankle.  This is normal after surgery.  Elevate the leg when you are not up walking on it.   °· Continue to use the breathing machine which will help keep your temperature down.  It is common for your temperature to cycle up and down following surgery, especially at night when you are not up moving around and exerting yourself.  The breathing machine keeps your lungs expanded and your temperature down. °· Do not place pillow under knee, focus on keeping the knee straight while resting ° °DIET °You may resume your previous home diet once your are discharged from the hospital. ° °DRESSING / WOUND CARE / SHOWERING °You may shower 3 days after surgery, but keep the wounds dry during showering.  You may use an occlusive plastic wrap (Press'n Seal for example), NO SOAKING/SUBMERGING IN THE BATHTUB.  If  the bandage gets wet, change with a clean dry gauze.  If the incision gets wet, pat the wound dry with a clean towel. °You may start showering once you are discharged home but do not submerge the incision under water. Just pat the incision dry and apply a dry gauze dressing on daily. °Change the surgical dressing daily and reapply a dry dressing each time. ° °ACTIVITY °Walk with your walker as instructed. °Use walker as long as suggested by your caregivers. °Avoid periods of inactivity such as sitting longer than an hour when not asleep. This helps prevent blood clots.  °You may resume a sexual relationship in one month or when given the OK by your doctor.  °You may return to work once you are cleared by your doctor.  °Do not drive a car for 6 weeks or until released by you surgeon.  °Do not drive while taking narcotics. ° °WEIGHT BEARING °Weight bearing as tolerated with assist device (walker, cane, etc) as directed, use it as long as suggested by your surgeon or therapist, typically at least 4-6 weeks. ° °POSTOPERATIVE CONSTIPATION PROTOCOL °Constipation - defined medically as fewer than three stools per week and severe constipation as less than one stool per week. ° °One of the most common issues patients have following surgery is constipation.  Even if you have a regular bowel pattern at home, your normal regimen is likely to be disrupted due to multiple reasons following surgery.  Combination of anesthesia, postoperative narcotics, change in appetite and fluid intake all can   affect your bowels.  In order to avoid complications following surgery, here are some recommendations in order to help you during your recovery period. ° °Colace (docusate) - Pick up an over-the-counter form of Colace or another stool softener and take twice a day as long as you are requiring postoperative pain medications.  Take with a full glass of water daily.  If you experience loose stools or diarrhea, hold the colace until you stool  forms back up.  If your symptoms do not get better within 1 week or if they get worse, check with your doctor. ° °Dulcolax (bisacodyl) - Pick up over-the-counter and take as directed by the product packaging as needed to assist with the movement of your bowels.  Take with a full glass of water.  Use this product as needed if not relieved by Colace only.  ° °MiraLax (polyethylene glycol) - Pick up over-the-counter to have on hand.  MiraLax is a solution that will increase the amount of water in your bowels to assist with bowel movements.  Take as directed and can mix with a glass of water, juice, soda, coffee, or tea.  Take if you go more than two days without a movement. °Do not use MiraLax more than once per day. Call your doctor if you are still constipated or irregular after using this medication for 7 days in a row. ° °If you continue to have problems with postoperative constipation, please contact the office for further assistance and recommendations.  If you experience "the worst abdominal pain ever" or develop nausea or vomiting, please contact the office immediatly for further recommendations for treatment. ° °ITCHING ° If you experience itching with your medications, try taking only a single pain pill, or even half a pain pill at a time.  You can also use Benadryl over the counter for itching or also to help with sleep.  ° °TED HOSE STOCKINGS °Wear the elastic stockings on both legs for three weeks following surgery during the day but you may remove then at night for sleeping. ° °MEDICATIONS °See your medication summary on the “After Visit Summary” that the nursing staff will review with you prior to discharge.  You may have some home medications which will be placed on hold until you complete the course of blood thinner medication.  It is important for you to complete the blood thinner medication as prescribed by your surgeon.  Continue your approved medications as instructed at time of  discharge. ° °PRECAUTIONS °If you experience chest pain or shortness of breath - call 911 immediately for transfer to the hospital emergency department.  °If you develop a fever greater that 101 F, purulent drainage from wound, increased redness or drainage from wound, foul odor from the wound/dressing, or calf pain - CONTACT YOUR SURGEON.   °                                                °FOLLOW-UP APPOINTMENTS °Make sure you keep all of your appointments after your operation with your surgeon and caregivers. You should call the office at the above phone number and make an appointment for approximately two weeks after the date of your surgery or on the date instructed by your surgeon outlined in the "After Visit Summary". ° ° °RANGE OF MOTION AND STRENGTHENING EXERCISES  °Rehabilitation of the knee is important following   a knee injury or an operation. After just a few days of immobilization, the muscles of the thigh which control the knee become weakened and shrink (atrophy). Knee exercises are designed to build up the tone and strength of the thigh muscles and to improve knee motion. Often times heat used for twenty to thirty minutes before working out will loosen up your tissues and help with improving the range of motion but do not use heat for the first two weeks following surgery. These exercises can be done on a training (exercise) mat, on the floor, on a table or on a bed. Use what ever works the best and is most comfortable for you Knee exercises include:  °Leg Lifts - While your knee is still immobilized in a splint or cast, you can do straight leg raises. Lift the leg to 60 degrees, hold for 3 sec, and slowly lower the leg. Repeat 10-20 times 2-3 times daily. Perform this exercise against resistance later as your knee gets better.  °Quad and Hamstring Sets - Tighten up the muscle on the front of the thigh (Quad) and hold for 5-10 sec. Repeat this 10-20 times hourly. Hamstring sets are done by pushing the  foot backward against an object and holding for 5-10 sec. Repeat as with quad sets.  °· Leg Slides: Lying on your back, slowly slide your foot toward your buttocks, bending your knee up off the floor (only go as far as is comfortable). Then slowly slide your foot back down until your leg is flat on the floor again. °· Angel Wings: Lying on your back spread your legs to the side as far apart as you can without causing discomfort.  °A rehabilitation program following serious knee injuries can speed recovery and prevent re-injury in the future due to weakened muscles. Contact your doctor or a physical therapist for more information on knee rehabilitation.  ° °IF YOU ARE TRANSFERRED TO A SKILLED REHAB FACILITY °If the patient is transferred to a skilled rehab facility following release from the hospital, a list of the current medications will be sent to the facility for the patient to continue.  When discharged from the skilled rehab facility, please have the facility set up the patient's Home Health Physical Therapy prior to being released. Also, the skilled facility will be responsible for providing the patient with their medications at time of release from the facility to include their pain medication, the muscle relaxants, and their blood thinner medication. If the patient is still at the rehab facility at time of the two week follow up appointment, the skilled rehab facility will also need to assist the patient in arranging follow up appointment in our office and any transportation needs. ° °MAKE SURE YOU:  °Understand these instructions.  °Get help right away if you are not doing well or get worse.  ° ° °Pick up stool softner and laxative for home use following surgery while on pain medications. °Do not submerge incision under water. °Please use good hand washing techniques while changing dressing each day. °May shower starting three days after surgery. °Please use a clean towel to pat the incision dry following  showers. °Continue to use ice for pain and swelling after surgery. °Do not use any lotions or creams on the incision until instructed by your surgeon. ° ° °Take Xarelto for two and a half more weeks following discharge from the hospital, then discontinue Xarelto. °Once the patient has completed the Xarelto, they may resume the 81 mg Aspirin. ° ° °  Information on my medicine - XARELTO® (Rivaroxaban) ° °This medication education was reviewed with me or my healthcare representative as part of my discharge preparation.  The pharmacist that spoke with me during my hospital stay was:  Kevin Q Tran, Student-PharmD ° °Why was Xarelto® prescribed for you? °Xarelto® was prescribed for you to reduce the risk of blood clots forming after orthopedic surgery. The medical term for these abnormal blood clots is venous thromboembolism (VTE). ° °What do you need to know about xarelto® ? °Take your Xarelto® ONCE DAILY at the same time every day. °You may take it either with or without food. ° °If you have difficulty swallowing the tablet whole, you may crush it and mix in applesauce just prior to taking your dose. ° °Take Xarelto® exactly as prescribed by your doctor and DO NOT stop taking Xarelto® without talking to the doctor who prescribed the medication.  Stopping without other VTE prevention medication to take the place of Xarelto® may increase your risk of developing a clot. ° °After discharge, you should have regular check-up appointments with your healthcare provider that is prescribing your Xarelto®.   ° °What do you do if you miss a dose? °If you miss a dose, take it as soon as you remember on the same day then continue your regularly scheduled once daily regimen the next day. Do not take two doses of Xarelto® on the same day.  ° °Important Safety Information °A possible side effect of Xarelto® is bleeding. You should call your healthcare provider right away if you experience any of the following: °? Bleeding from an injury  or your nose that does not stop. °? Unusual colored urine (red or dark brown) or unusual colored stools (red or black). °? Unusual bruising for unknown reasons. °? A serious fall or if you hit your head (even if there is no bleeding). ° °Some medicines may interact with Xarelto® and might increase your risk of bleeding while on Xarelto®. To help avoid this, consult your healthcare provider or pharmacist prior to using any new prescription or non-prescription medications, including herbals, vitamins, non-steroidal anti-inflammatory drugs (NSAIDs) and supplements. ° °This website has more information on Xarelto®: www.xarelto.com. ° ° ° °

## 2016-11-27 NOTE — Evaluation (Addendum)
Physical Therapy Evaluation Patient Details Name: Kelly Blake MRN: [id number] DOB: 06/23/1941 Today's Date: 11/27/2016   History of Present Illness  Pt is a 76 year old female s/p R TKA  Clinical Impression  Pt is s/p TKA resulting in the deficits listed below (see PT Problem List).   Pt will benefit from skilled PT to increase their independence and safety with mobility to allow discharge to the venue listed below.  Pt tolerated short distance ambulation well POD #1.  Pt plans to d/c home with assist from spouse.     Follow Up Recommendations Other (comment) (virtual therapy)    Equipment Recommendations  None recommended by PT    Recommendations for Other Services       Precautions / Restrictions Precautions Precautions: Knee Precaution Comments: able to perform SLR Required Braces or Orthoses: Knee Immobilizer - Right Restrictions Weight Bearing Restrictions: No Other Position/Activity Restrictions: WBAT      Mobility  Bed Mobility Overal bed mobility: Needs Assistance Bed Mobility: Supine to Sit     Supine to sit: Supervision;HOB elevated     General bed mobility comments: pt up in recliner on arrival  Transfers Overall transfer level: Needs assistance Equipment used: Rolling walker (2 wheeled) Transfers: Sit to/from Stand Sit to Stand: Min guard Stand pivot transfers: Min guard       General transfer comment: pt able to recall correct UE placement, cues for LE positioning for pain control  Ambulation/Gait Ambulation/Gait assistance: Min guard Ambulation Distance (Feet): 60 Feet Assistive device: Rolling walker (2 wheeled) Gait Pattern/deviations: Step-to pattern;Decreased stance time - right;Antalgic     General Gait Details: verbal cues for sequence, RW positioning, step length  Stairs            Wheelchair Mobility    Modified Rankin (Stroke Patients Only)       Balance                                              Pertinent Vitals/Pain Pain Assessment: 0-10 Pain Score: 2  Pain Location: R knee Pain Descriptors / Indicators: Aching;Sore Pain Intervention(s): Limited activity within patient's tolerance;Monitored during session;Repositioned    Home Living Family/patient expects to be discharged to:: Private residence Living Arrangements: Spouse/significant other   Type of Home: House Home Access: Stairs to enter Entrance Stairs-Rails: Doctor, general practiceight;Left Entrance Stairs-Number of Steps: 3 Home Layout: Able to live on main level with bedroom/bathroom;Two level Home Equipment: Toilet riser;Shower seat;Walker - 2 wheels;Cane - single point;Crutches      Prior Function Level of Independence: Independent               Hand Dominance        Extremity/Trunk Assessment   Upper Extremity Assessment Upper Extremity Assessment: Overall WFL for tasks assessed    Lower Extremity Assessment Lower Extremity Assessment: RLE deficits/detail RLE Deficits / Details: able to perform SLR, ROM TBA       Communication   Communication: No difficulties  Cognition Arousal/Alertness: Awake/alert Behavior During Therapy: WFL for tasks assessed/performed Overall Cognitive Status: Within Functional Limits for tasks assessed                                        General Comments      Exercises  Assessment/Plan    PT Assessment Patient needs continued PT services  PT Problem List Decreased range of motion;Decreased strength;Decreased mobility;Decreased knowledge of use of DME;Pain;Decreased knowledge of precautions       PT Treatment Interventions Functional mobility training;Stair training;Gait training;DME instruction;Therapeutic activities;Therapeutic exercise;Patient/family education    PT Goals (Current goals can be found in the Care Plan section)  Acute Rehab PT Goals Patient Stated Goal: return to independence PT Goal Formulation: With patient Time For Goal  Achievement: 11/30/16 Potential to Achieve Goals: Good    Frequency 7X/week   Barriers to discharge        Co-evaluation               End of Session   Activity Tolerance: Patient tolerated treatment well Patient left: in chair;with call bell/phone within reach;with nursing/sitter in room   PT Visit Diagnosis: Other abnormalities of gait and mobility (R26.89);Pain Pain - Right/Left: Right Pain - part of body: Knee    Time: 1610-9604 PT Time Calculation (min) (ACUTE ONLY): 12 min   Charges:   PT Evaluation $PT Eval Low Complexity: 1 Procedure     PT G Codes:        Zenovia Jarred, PT, DPT 11/27/2016 Pager: [phone]   Maida Sale E 11/27/2016, 12:18 PM

## 2016-11-27 NOTE — Progress Notes (Signed)
   11/27/16 1500  PT Visit Information  Last PT Received On 11/27/16  Assistance Needed +1  History of Present Illness Pt is a 76 year old female s/p R TKA  Subjective Data  Subjective Pt ambulated in hallway again and reports increased pain and soreness this afternoon.  Pt performed LE exercises once returned to supine.  Plans to d/c home tomorrow.  Precautions  Precautions Knee  Precaution Comments able to perform SLR  Restrictions  Other Position/Activity Restrictions WBAT  Pain Assessment  Pain Assessment 0-10  Pain Score 5  Pain Location R knee  Pain Descriptors / Indicators Aching;Sore  Pain Intervention(s) Limited activity within patient's tolerance;Monitored during session;Repositioned;Premedicated before session  Cognition  Arousal/Alertness Awake/alert  Behavior During Therapy WFL for tasks assessed/performed  Overall Cognitive Status Within Functional Limits for tasks assessed  Bed Mobility  Overal bed mobility Needs Assistance  Bed Mobility Sit to Supine  Sit to supine Supervision  Transfers  Overall transfer level Needs assistance  Equipment used Rolling walker (2 wheeled)  Transfers Sit to/from Stand  Sit to Stand Min guard  General transfer comment verbal cues for UE/LE positioning upon return to bed  Ambulation/Gait  Ambulation/Gait assistance Min guard  Ambulation Distance (Feet) 80 Feet  Assistive device Rolling walker (2 wheeled)  Gait Pattern/deviations Step-to pattern;Decreased stance time - right;Antalgic  General Gait Details verbal cues for sequence, RW positioning, step length  Exercises  Exercises Total Joint  Total Joint Exercises  Ankle Circles/Pumps AROM;Both;10 reps  Quad Sets AROM;Right;10 reps  Short Arc Quad AROM;Right;10 reps  Heel Slides AAROM;Right;10 reps  Hip ABduction/ADduction AROM;Right;10 reps  Straight Leg Raises AROM;Right;10 reps  Goniometric ROM approx 40* AAROM R knee flexion  PT - End of Session  Activity Tolerance  Patient tolerated treatment well  Patient left in bed;with call bell/phone within reach;with family/visitor present  PT - Assessment/Plan  PT Plan Current plan remains appropriate  PT Visit Diagnosis Other abnormalities of gait and mobility (R26.89);Pain  Pain - Right/Left Right  Pain - part of body Knee  PT Frequency (ACUTE ONLY) 7X/week  Follow Up Recommendations Other (comment) (virtual therapy)  PT equipment None recommended by PT  AM-PAC PT "6 Clicks" Daily Activity Outcome Measure  Difficulty turning over in bed (including adjusting bedclothes, sheets and blankets)? 4  Difficulty moving from lying on back to sitting on the side of the bed?  3  Difficulty sitting down on and standing up from a chair with arms (e.g., wheelchair, bedside commode, etc,.)? 3  Help needed moving to and from a bed to chair (including a wheelchair)? 3  Help needed walking in hospital room? 3  Help needed climbing 3-5 steps with a railing?  3  6 Click Score 19  Mobility G Code  CJ  PT Goal Progression  Progress towards PT goals Progressing toward goals  PT Time Calculation  PT Start Time (ACUTE ONLY) 1329  PT Stop Time (ACUTE ONLY) 1349  PT Time Calculation (min) (ACUTE ONLY) 20 min  PT General Charges  $$ ACUTE PT VISIT 1 Procedure  PT Treatments  $Therapeutic Exercise 8-22 mins   Zenovia JarredKati Fateh Kindle, PT, DPT 11/27/2016 Pager: [phone]

## 2016-11-27 NOTE — Progress Notes (Signed)
   Subjective: 1 Day Post-Op Procedure(s) (LRB): RIGHT TOTAL KNEE ARTHROPLASTY (Right) Patient reports pain as mild.   Patient seen in rounds for Dr. Lequita HaltAluisio. Patient is well, but has had some minor complaints of pain in the knee, requiring pain medications We will start therapy today.  Plan is to go Home after hospital stay.  Objective: Vital signs in last 24 hours: Temp:  [97.4 F (36.3 C)-99 F (37.2 C)] 99 F (37.2 C) (03/27 1813) Pulse Rate:  [61-74] 74 (03/27 1813) Resp:  [15-20] 20 (03/27 1813) BP: (114-155)/(54-67) 155/64 (03/27 1813) SpO2:  [93 %-100 %] 93 % (03/27 1813)  Intake/Output from previous day:  Intake/Output Summary (Last 24 hours) at 11/27/16 2154 Last data filed at 11/27/16 1841  Gross per 24 hour  Intake             1897 ml  Output             2400 ml  Net             -503 ml    Intake/Output this shift: No intake/output data recorded.  Labs:  Recent Labs  11/27/16 0424  HGB 10.5*    Recent Labs  11/27/16 0424  WBC 10.2  RBC 3.33*  HCT 30.4*  PLT 238    Recent Labs  11/27/16 0424  NA 139  K 4.4  CL 108  CO2 26  BUN 20  CREATININE 0.64  GLUCOSE 166*  CALCIUM 9.2   No results for input(s): LABPT, INR in the last 72 hours.  EXAM General - Patient is Alert, Appropriate and Oriented Extremity - Neurovascular intact Sensation intact distally Intact pulses distally Dorsiflexion/Plantar flexion intact Dressing - dressing C/D/I Motor Function - intact, moving foot and toes well on exam.  Hemovac pulled without difficulty.  Past Medical History:  Diagnosis Date  . Allergy   . Arthritis   . Chicken pox   . Chronic kidney disease    stones  . Colon polyps   . Diabetes mellitus without complication (HCC)   . Diverticulitis   . Hypertension     Assessment/Plan: 1 Day Post-Op Procedure(s) (LRB): RIGHT TOTAL KNEE ARTHROPLASTY (Right) Principal Problem:   OA (osteoarthritis) of knee  Estimated body mass index is 25.78  kg/m as calculated from the following:   Height as of this encounter: 5' (1.524 m).   Weight as of this encounter: 59.9 kg (132 lb). Up with therapy  DVT Prophylaxis - Xarelto Weight-Bearing as tolerated to right leg D/C O2 and Pulse OX and try on Room Air Home with In-Home VERA probably tomorrow  Avel Peacerew Tiffony Kite, PA-C Orthopaedic Surgery 11/27/2016, 9:54 PM

## 2016-11-27 NOTE — Discharge Summary (Signed)
Physician Discharge Summary   Patient ID: Kelly Blake MRN: [id number] DOB/AGE: 10/22/1940 76 y.o.  Admit date: 11/26/2016 Discharge date: 11-28-2016  Primary Diagnosis:  Osteoarthritis  Right knee(s)  Admission Diagnoses:  Past Medical History:  Diagnosis Date  . Allergy   . Arthritis   . Chicken pox   . Chronic kidney disease    stones  . Colon polyps   . Diabetes mellitus without complication ([facility])   . Diverticulitis   . Hypertension    Discharge Diagnoses:   Principal Problem:   OA (osteoarthritis) of knee  Estimated body mass index is 25.78 kg/m as calculated from the following:   Height as of this encounter: 5' (1.524 m).   Weight as of this encounter: 59.9 kg (132 lb).  Procedure:  Procedure(s) (LRB): RIGHT TOTAL KNEE ARTHROPLASTY (Right)   Consults: None  HPI: Kelly Blake is a 76 y.o. year old female with end stage OA of her right knee with progressively worsening pain and dysfunction. She has constant pain, with activity and at rest and significant functional deficits with difficulties even with ADLs. She has had extensive non-op management including analgesics, injections of cortisone and viscosupplements, and home exercise program, but remains in significant pain with significant dysfunction.Radiographs show bone on bone arthritis medial and patellofemoral. She presents now for right Total Knee Arthroplasty.    Laboratory Data: Admission on 11/26/2016  Component Date Value Ref Range Status  . Glucose-Capillary 11/26/2016 126* 65 - 99 mg/dL Final  . Comment 1 11/26/2016 Notify RN   Final  . Glucose-Capillary 11/26/2016 139* 65 - 99 mg/dL Final  . Comment 1 11/26/2016 Notify RN   Final  . Comment 2 11/26/2016 Document in Chart   Final  . WBC 11/27/2016 10.2  4.0 - 10.5 K/uL Final  . RBC 11/27/2016 3.33* 3.87 - 5.11 MIL/uL Final  . Hemoglobin 11/27/2016 10.5* 12.0 - 15.0 g/dL Final  . HCT 11/27/2016 30.4* 36.0 - 46.0 % Final  . MCV 11/27/2016 91.3   78.0 - 100.0 fL Final  . MCH 11/27/2016 31.5  26.0 - 34.0 pg Final  . MCHC 11/27/2016 34.5  30.0 - 36.0 g/dL Final  . RDW 11/27/2016 13.1  11.5 - 15.5 % Final  . Platelets 11/27/2016 238  150 - 400 K/uL Final  . Sodium 11/27/2016 139  135 - 145 mmol/L Final  . Potassium 11/27/2016 4.4  3.5 - 5.1 mmol/L Final  . Chloride 11/27/2016 108  101 - 111 mmol/L Final  . CO2 11/27/2016 26  22 - 32 mmol/L Final  . Glucose, Bld 11/27/2016 166* 65 - 99 mg/dL Final  . BUN 11/27/2016 20  6 - 20 mg/dL Final  . Creatinine, Ser 11/27/2016 0.64  0.44 - 1.00 mg/dL Final  . Calcium 11/27/2016 9.2  8.9 - 10.3 mg/dL Final  . GFR calc non Af Amer 11/27/2016 >60  >60 mL/min Final  . GFR calc Af Amer 11/27/2016 >60  >60 mL/min Final   Comment: (NOTE) The eGFR has been calculated using the CKD EPI equation. This calculation has not been validated in all clinical situations. eGFR's persistently <60 mL/min signify possible Chronic Kidney Disease.   . Anion gap 11/27/2016 5  5 - 15 Final  . Glucose-Capillary 11/26/2016 206* 65 - 99 mg/dL Final  . Glucose-Capillary 11/26/2016 156* 65 - 99 mg/dL Final  . Glucose-Capillary 11/27/2016 143* 65 - 99 mg/dL Final  . Glucose-Capillary 11/27/2016 127* 65 - 99 mg/dL Final  . Glucose-Capillary 11/27/2016 128* 65 -  99 mg/dL Final  Hospital Outpatient Visit on 11/21/2016  Component Date Value Ref Range Status  . Glucose-Capillary 11/21/2016 99  65 - 99 mg/dL Final  . MRSA, PCR 11/21/2016 NEGATIVE  NEGATIVE Final  . Staphylococcus aureus 11/21/2016 NEGATIVE  NEGATIVE Final   Comment:        The Xpert SA Assay (FDA approved for NASAL specimens in patients over 51 years of age), is one component of a comprehensive surveillance program.  Test performance has been validated by [facility] for patients greater than or equal to 77 year old. It is not intended to diagnose infection nor to guide or monitor treatment.   Marland Kitchen aPTT 11/21/2016 31  24 - 36 seconds Final  . WBC  11/21/2016 7.6  4.0 - 10.5 K/uL Final  . RBC 11/21/2016 4.02  3.87 - 5.11 MIL/uL Final  . Hemoglobin 11/21/2016 12.9  12.0 - 15.0 g/dL Final  . HCT 11/21/2016 38.0  36.0 - 46.0 % Final  . MCV 11/21/2016 94.5  78.0 - 100.0 fL Final  . MCH 11/21/2016 32.1  26.0 - 34.0 pg Final  . MCHC 11/21/2016 33.9  30.0 - 36.0 g/dL Final  . RDW 11/21/2016 13.1  11.5 - 15.5 % Final  . Platelets 11/21/2016 294  150 - 400 K/uL Final  . Sodium 11/21/2016 140  135 - 145 mmol/L Final  . Potassium 11/21/2016 4.7  3.5 - 5.1 mmol/L Final  . Chloride 11/21/2016 105  101 - 111 mmol/L Final  . CO2 11/21/2016 27  22 - 32 mmol/L Final  . Glucose, Bld 11/21/2016 101* 65 - 99 mg/dL Final  . BUN 11/21/2016 23* 6 - 20 mg/dL Final  . Creatinine, Ser 11/21/2016 0.69  0.44 - 1.00 mg/dL Final  . Calcium 11/21/2016 10.0  8.9 - 10.3 mg/dL Final  . Total Protein 11/21/2016 7.1  6.5 - 8.1 g/dL Final  . Albumin 11/21/2016 4.4  3.5 - 5.0 g/dL Final  . AST 11/21/2016 25  15 - 41 U/L Final  . ALT 11/21/2016 19  14 - 54 U/L Final  . Alkaline Phosphatase 11/21/2016 67  38 - 126 U/L Final  . Total Bilirubin 11/21/2016 1.0  0.3 - 1.2 mg/dL Final  . GFR calc non Af Amer 11/21/2016 >60  >60 mL/min Final  . GFR calc Af Amer 11/21/2016 >60  >60 mL/min Final   Comment: (NOTE) The eGFR has been calculated using the CKD EPI equation. This calculation has not been validated in all clinical situations. eGFR's persistently <60 mL/min signify possible Chronic Kidney Disease.   . Anion gap 11/21/2016 8  5 - 15 Final  . Prothrombin Time 11/21/2016 12.9  11.4 - 15.2 seconds Final  . INR 11/21/2016 0.97   Final  . ABO/RH(D) 11/21/2016 A POS   Final  . Antibody Screen 11/21/2016 NEG   Final  . Sample Expiration 11/21/2016 11/29/2016   Final  . Extend sample reason 11/21/2016 NO TRANSFUSIONS OR PREGNANCY IN THE PAST 3 MONTHS   Final  . ABO/RH(D) 11/21/2016 A POS   Final  Office Visit on 11/07/2016  Component Date Value Ref Range Status  .  Hemoglobin A1C 11/07/2016 6.4   Final     X-Rays:No results found.  EKG: Orders placed or performed in visit on 11/07/16  . EKG 12-Lead     Hospital Course: Kelly Blake is a 76 y.o. who [facility] They were brought to the operating room on 11/26/2016 and underwent Procedure(s): RIGHT  TOTAL KNEE ARTHROPLASTY.  Patient tolerated the procedure well and was later transferred to the recovery room and then to the orthopaedic floor for postoperative care.  They were given PO and IV analgesics for pain control following their surgery.  They were given 24 hours of postoperative antibiotics of  Anti-infectives    Start     Dose/Rate Route Frequency Ordered Stop   11/26/16 1700  ceFAZolin (ANCEF) IVPB 2g/100 mL premix     2 g 200 mL/hr over 30 Minutes Intravenous Every 6 hours 11/26/16 1431 11/27/16 0036   11/26/16 0916  ceFAZolin (ANCEF) 2-4 GM/100ML-% IVPB    Comments:  Mardelle Matte   : cabinet override      11/26/16 0916 11/26/16 1054   11/26/16 0911  ceFAZolin (ANCEF) IVPB 2g/100 mL premix     2 g 200 mL/hr over 30 Minutes Intravenous On call to O.R. 11/26/16 0911 11/26/16 1054     and started on DVT prophylaxis in the form of Xarelto.   PT and OT were ordered for total joint protocol.  Discharge planning consulted to help with postop disposition and equipment needs.  Patient had a tough night on the evening of surgery.  They started to get up OOB with therapy on day one. Hemovac drain was pulled without difficulty.  Continued to work with therapy into day two.  Dressing was changed on day two and the incision was healing well.   Patient was seen in rounds on day two, doing better and was ready to go home.  Discharge home - In-Home [facility] Diet - Diabetic diet and Renal diet Follow up - in 2 weeks Activity - WBAT Disposition - Home Condition Upon Discharge - Good D/C Meds - See DC Summary DVT Prophylaxis - Xarelto   Discharge Instructions    Call MD /  Call 911    Complete by:  As directed    If you experience chest pain or shortness of breath, CALL 911 and be transported to the hospital emergency room.  If you develope a fever above 101 F, pus (white drainage) or increased drainage or redness at the wound, or calf pain, call your surgeon's office.   Change dressing    Complete by:  As directed    Change dressing daily with sterile 4 x 4 inch gauze dressing and apply TED hose. Do not submerge the incision under water.   Constipation Prevention    Complete by:  As directed    Drink plenty of fluids.  Prune juice may be helpful.  You may use a stool softener, such as Colace (over the counter) 100 mg twice a day.  Use MiraLax (over the counter) for constipation as needed.   Diet - low sodium heart healthy    Complete by:  As directed    Diet Carb Modified    Complete by:  As directed    Discharge instructions    Complete by:  As directed    Pick up stool softner and laxative for home use following surgery while on pain medications. Do not submerge incision under water. Please use good hand washing techniques while changing dressing each day. May shower starting three days after surgery. Please use a clean towel to pat the incision dry following showers. Continue to use ice for pain and swelling after surgery. Do not use any lotions or creams on the incision until instructed by your surgeon.  Wear both TED hose on both legs during the day every day for three  weeks, but may have off at night at home.  Postoperative Constipation Protocol  Constipation - defined medically as fewer than three stools per week and severe constipation as less than one stool per week.  One of the most common issues patients have following surgery is constipation.  Even if you have a regular bowel pattern at home, your normal regimen is likely to be disrupted due to multiple reasons following surgery.  Combination of anesthesia, postoperative narcotics, change in  appetite and fluid intake all can affect your bowels.  In order to avoid complications following surgery, here are some recommendations in order to help you during your recovery period.  Colace (docusate) - Pick up an over-the-counter form of Colace or another stool softener and take twice a day as long as you are requiring postoperative pain medications.  Take with a full glass of water daily.  If you experience loose stools or diarrhea, hold the colace until you stool forms back up.  If your symptoms do not get better within 1 week or if they get worse, check with your doctor.  Dulcolax (bisacodyl) - Pick up over-the-counter and take as directed by the product packaging as needed to assist with the movement of your bowels.  Take with a full glass of water.  Use this product as needed if not relieved by Colace only.   MiraLax (polyethylene glycol) - Pick up over-the-counter to have on hand.  MiraLax is a solution that will increase the amount of water in your bowels to assist with bowel movements.  Take as directed and can mix with a glass of water, juice, soda, coffee, or tea.  Take if you go more than two days without a movement. Do not use MiraLax more than once per day. Call your doctor if you are still constipated or irregular after using this medication for 7 days in a row.  If you continue to have problems with postoperative constipation, please contact the office for further assistance and recommendations.  If you experience "the worst abdominal pain ever" or develop nausea or vomiting, please contact the office immediatly for further recommendations for treatment.   Take Xarelto for two and a half more weeks, then discontinue Xarelto. Once the patient has completed the Xarelto, they may resume the 81 mg Aspirin.   Do not put a pillow under the knee. Place it under the heel.    Complete by:  As directed    Do not sit on low chairs, stoools or toilet seats, as it may be difficult to get up  from low surfaces    Complete by:  As directed    Driving restrictions    Complete by:  As directed    No driving until released by the physician.   Increase activity slowly as tolerated    Complete by:  As directed    Lifting restrictions    Complete by:  As directed    No lifting until released by the physician.   Patient may shower    Complete by:  As directed    You may shower without a dressing once there is no drainage.  Do not wash over the wound.  If drainage remains, do not shower until drainage stops.   TED hose    Complete by:  As directed    Use stockings (TED hose) for 3 weeks on both leg(s).  You may remove them at night for sleeping.   Weight bearing as tolerated    Complete by:  As directed    Laterality:  right   Extremity:  Lower     Allergies as of 11/27/2016   No Known Allergies     Medication List    STOP taking these medications   aspirin 81 MG tablet   vitamin B-12 500 MCG tablet Commonly known as:  CYANOCOBALAMIN   Vitamin D (Cholecalciferol) 1000 units Tabs   zinc gluconate 50 MG tablet     TAKE these medications   COMBIGAN 0.2-0.5 % ophthalmic solution Generic drug:  brimonidine-timolol Place 1 drop into both eyes every 12 (twelve) hours.   metFORMIN 500 MG tablet Commonly known as:  GLUCOPHAGE TAKE 1 TABLET BY MOUTH  DAILY BEFORE BREAKFAST   methocarbamol 500 MG tablet Commonly known as:  ROBAXIN Take 1 tablet (500 mg total) by mouth every 6 (six) hours as needed for muscle spasms.   oxyCODONE 5 MG immediate release tablet Commonly known as:  Oxy IR/ROXICODONE Take 1-2 tablets (5-10 mg total) by mouth every 4 (four) hours as needed for moderate pain or severe pain.   pravastatin 20 MG tablet Commonly known as:  PRAVACHOL TAKE 1 TABLET BY MOUTH  DAILY   rivaroxaban 10 MG Tabs tablet Commonly known as:  XARELTO Take 1 tablet (10 mg total) by mouth daily with breakfast. Take Xarelto for two and a half more weeks following discharge  from the hospital, then discontinue Xarelto. Once the patient has completed the Xarelto, they may resume the 81 mg Aspirin. Start taking on:  11/28/2016   traMADol 50 MG tablet Commonly known as:  ULTRAM Take 1-2 tablets (50-100 mg total) by mouth every 6 (six) hours as needed for moderate pain.            Durable Medical Equipment        Start     Ordered   11/27/16 1102  For home use only DME 3 n 1  Once     11/27/16 1101     [facility] Follow up.   Why:  3n1 (over the commode set or bedside commode) Contact information: [address] [place]  [address] [phone]        Gearlean Alf, MD. Schedule an appointment as soon as possible for a visit on 12/11/2016.   Specialty:  Orthopedic Surgery Contact information: [address] [place] [address] [phone]           Signed: Arlee Muslim, PA-C Orthopaedic Surgery 11/27/2016, 10:01 PM

## 2016-11-28 LAB — CBC
HCT: 28.7 % — ABNORMAL LOW (ref 36.0–46.0)
Hemoglobin: 9.8 g/dL — ABNORMAL LOW (ref 12.0–15.0)
MCH: 31.8 pg (ref 26.0–34.0)
MCHC: 34.1 g/dL (ref 30.0–36.0)
MCV: 93.2 fL (ref 78.0–100.0)
Platelets: 223 10*3/uL (ref 150–400)
RBC: 3.08 MIL/uL — AB (ref 3.87–5.11)
RDW: 13.5 % (ref 11.5–15.5)
WBC: 8.9 10*3/uL (ref 4.0–10.5)

## 2016-11-28 LAB — BASIC METABOLIC PANEL
Anion gap: 4 — ABNORMAL LOW (ref 5–15)
BUN: 19 mg/dL (ref 6–20)
CHLORIDE: 106 mmol/L (ref 101–111)
CO2: 27 mmol/L (ref 22–32)
CREATININE: 0.67 mg/dL (ref 0.44–1.00)
Calcium: 8.7 mg/dL — ABNORMAL LOW (ref 8.9–10.3)
GFR calc Af Amer: >60 mL/min (ref >60)
GFR calc non Af Amer: >60 mL/min (ref >60)
GLUCOSE: 127 mg/dL — AB (ref 65–99)
POTASSIUM: 3.6 mmol/L (ref 3.5–5.1)
Sodium: 137 mmol/L (ref 135–145)

## 2016-11-28 LAB — GLUCOSE, CAPILLARY
GLUCOSE-CAPILLARY: 120 mg/dL — AB (ref 65–99)
Glucose-Capillary: 123 mg/dL — ABNORMAL HIGH (ref 65–99)

## 2016-11-28 NOTE — Progress Notes (Signed)
   Subjective: 2 Days Post-Op Procedure(s) (LRB): RIGHT TOTAL KNEE ARTHROPLASTY (Right) Patient reports pain as mild.   Patient seen in rounds with Dr. Lequita HaltAluisio. Patient is well, and has had no acute complaints or problems Patient is ready to go home  Objective: Vital signs in last 24 hours: Temp:  [98.1 F (36.7 C)-99 F (37.2 C)] 98.8 F (37.1 C) (03/28 0515) Pulse Rate:  [69-75] 75 (03/28 0515) Resp:  [18-20] 18 (03/28 0515) BP: (131-159)/(40-65) 131/40 (03/28 0515) SpO2:  [93 %-97 %] 94 % (03/28 0515)  Intake/Output from previous day:  Intake/Output Summary (Last 24 hours) at 11/28/16 0934 Last data filed at 11/28/16 0516  Gross per 24 hour  Intake              782 ml  Output             1250 ml  Net             -468 ml    Intake/Output this shift: No intake/output data recorded.  Labs:  Recent Labs  11/27/16 0424 11/28/16 0418  HGB 10.5* 9.8*    Recent Labs  11/27/16 0424 11/28/16 0418  WBC 10.2 8.9  RBC 3.33* 3.08*  HCT 30.4* 28.7*  PLT 238 223    Recent Labs  11/27/16 0424 11/28/16 0418  NA 139 137  K 4.4 3.6  CL 108 106  CO2 26 27  BUN 20 19  CREATININE 0.64 0.67  GLUCOSE 166* 127*  CALCIUM 9.2 8.7*   No results for input(s): LABPT, INR in the last 72 hours.  EXAM: General - Patient is Alert and Appropriate Extremity - Neurovascular intact Sensation intact distally Incision - clean, dry Motor Function - intact, moving foot and toes well on exam.   Assessment/Plan: 2 Days Post-Op Procedure(s) (LRB): RIGHT TOTAL KNEE ARTHROPLASTY (Right) Procedure(s) (LRB): RIGHT TOTAL KNEE ARTHROPLASTY (Right) Past Medical History:  Diagnosis Date  . Allergy   . Arthritis   . Chicken pox   . Chronic kidney disease    stones  . Colon polyps   . Diabetes mellitus without complication ([facility])   . Diverticulitis   . Hypertension    Principal Problem:   OA (osteoarthritis) of knee  Estimated body mass index is 25.78 kg/m as calculated from  the following:   Height as of this encounter: 5' (1.524 m).   Weight as of this encounter: 59.9 kg (132 lb). Up with therapy Discharge home - In-Home [facility] Diet - Diabetic diet and Renal diet Follow up - in 2 weeks Activity - WBAT Disposition - Home Condition Upon Discharge - Good D/C Meds - See DC Summary DVT Prophylaxis - Xarelto  Avel Peacerew Perkins, PA-C Orthopaedic Surgery 11/28/2016, 9:34 AM

## 2016-11-28 NOTE — Progress Notes (Signed)
qPhysical Therapy Treatment Patient Details Name: Kelly Blake MRN: [id number] DOB: 08-07-1941 Today's Date: 11/28/2016    History of Present Illness Pt is a 76 year old female s/p R TKA    PT Comments    Pt reports increased soreness and pain today despite premedication.  Pt able to ambulate in hallway and practiced safe stair technique.  Handout on stairs and HEP provided.  Pt plans to perform her exercises once settled at home.  Pt to d/c home today.   Follow Up Recommendations  Other (comment) (virtual therapy)     Equipment Recommendations  None recommended by PT    Recommendations for Other Services       Precautions / Restrictions Precautions Precautions: Knee Restrictions Other Position/Activity Restrictions: WBAT    Mobility  Bed Mobility Overal bed mobility: Needs Assistance Bed Mobility: Supine to Sit     Supine to sit: Min assist Sit to supine: Min assist   General bed mobility comments: assist for R LE today due to soreness and pain  Transfers Overall transfer level: Needs assistance Equipment used: Rolling walker (2 wheeled) Transfers: Sit to/from Stand Sit to Stand: Min guard         General transfer comment: verbal cues for UE/LE positioning   Ambulation/Gait Ambulation/Gait assistance: Min guard Ambulation Distance (Feet): 80 Feet Assistive device: Rolling walker (2 wheeled) Gait Pattern/deviations: Step-to pattern;Decreased stance time - right;Antalgic     General Gait Details: verbal cues for sequence, RW positioning, step length   Stairs Stairs: Yes   Stair Management: Step to pattern;Backwards;With walker Number of Stairs: 3 General stair comments: verbal cues for sequence, safety, technique, pt aware spouse needs to hold RW, provided handout  Wheelchair Mobility    Modified Rankin (Stroke Patients Only)       Balance                                            Cognition Arousal/Alertness:  Awake/alert Behavior During Therapy: WFL for tasks assessed/performed Overall Cognitive Status: Within Functional Limits for tasks assessed                                        Exercises      General Comments        Pertinent Vitals/Pain Pain Assessment: 0-10 Pain Score: 7  Pain Location: R knee Pain Descriptors / Indicators: Aching;Sore Pain Intervention(s): Limited activity within patient's tolerance;Premedicated before session;Monitored during session;Ice applied    Home Living                      Prior Function            PT Goals (current goals can now be found in the care plan section) Progress towards PT goals: Progressing toward goals    Frequency    7X/week      PT Plan Current plan remains appropriate    Co-evaluation             End of Session Equipment Utilized During Treatment: Gait belt Activity Tolerance: Patient tolerated treatment well;Patient limited by pain Patient left: in bed;with call bell/phone within reach;with family/visitor present   PT Visit Diagnosis: Other abnormalities of gait and mobility (R26.89);Pain Pain - Right/Left: Right Pain - part of body:  Knee     Time: 4098-1191 PT Time Calculation (min) (ACUTE ONLY): 26 min  Charges:  $Gait Training: 8-22 mins                    G Codes:      Zenovia Jarred, PT, DPT 11/28/2016 Pager: [phone]    Maida Sale E 11/28/2016, 3:35 PM

## 2016-12-31 ENCOUNTER — Other Ambulatory Visit: Payer: Self-pay | Admitting: Family Medicine

## 2017-01-25 ENCOUNTER — Other Ambulatory Visit: Payer: Self-pay | Admitting: *Deleted

## 2017-01-25 MED ORDER — PRAVASTATIN SODIUM 20 MG PO TABS: 20.0000 mg | ORAL_TABLET | Freq: Every day | ORAL | 0 refills | Status: DC

## 2017-03-12 ENCOUNTER — Other Ambulatory Visit: Payer: Self-pay | Admitting: Family Medicine

## 2017-04-02 ENCOUNTER — Encounter: Payer: Self-pay | Admitting: Family Medicine

## 2017-04-02 ENCOUNTER — Ambulatory Visit (INDEPENDENT_AMBULATORY_CARE_PROVIDER_SITE_OTHER): Payer: Medicare Other | Admitting: Family Medicine

## 2017-04-02 VITALS — BP 140/80 | HR 68 | Temp 98.2°F | Ht 60.0 in | Wt 127.2 lb

## 2017-04-02 DIAGNOSIS — Z0000 Encounter for general adult medical examination without abnormal findings: Secondary | ICD-10-CM | POA: Diagnosis not present

## 2017-04-02 DIAGNOSIS — Z23 Encounter for immunization: Secondary | ICD-10-CM | POA: Diagnosis not present

## 2017-04-02 DIAGNOSIS — E785 Hyperlipidemia, unspecified: Secondary | ICD-10-CM

## 2017-04-02 DIAGNOSIS — D649 Anemia, unspecified: Secondary | ICD-10-CM

## 2017-04-02 DIAGNOSIS — E119 Type 2 diabetes mellitus without complications: Secondary | ICD-10-CM

## 2017-04-02 DIAGNOSIS — I10 Essential (primary) hypertension: Secondary | ICD-10-CM | POA: Diagnosis not present

## 2017-04-02 LAB — CBC WITH DIFFERENTIAL/PLATELET
BASOS PCT: 0.7 % (ref 0.0–3.0)
Basophils Absolute: 0 10*3/uL (ref 0.0–0.1)
EOS PCT: 5.7 % — AB (ref 0.0–5.0)
Eosinophils Absolute: 0.2 10*3/uL (ref 0.0–0.7)
HCT: 38.3 % (ref 36.0–46.0)
HEMOGLOBIN: 12.7 g/dL (ref 12.0–15.0)
Lymphocytes Relative: 24.2 % (ref 12.0–46.0)
Lymphs Abs: 1 10*3/uL (ref 0.7–4.0)
MCHC: 33.3 g/dL (ref 30.0–36.0)
MCV: 90.8 fl (ref 78.0–100.0)
MONO ABS: 0.4 10*3/uL (ref 0.1–1.0)
Monocytes Relative: 8.3 % (ref 3.0–12.0)
Neutro Abs: 2.6 10*3/uL (ref 1.4–7.7)
Neutrophils Relative %: 61.1 % (ref 43.0–77.0)
Platelets: 290 10*3/uL (ref 150.0–400.0)
RBC: 4.22 Mil/uL (ref 3.87–5.11)
RDW: 14.2 % (ref 11.5–15.5)
WBC: 4.2 10*3/uL (ref 4.0–10.5)

## 2017-04-02 LAB — MICROALBUMIN / CREATININE URINE RATIO
Creatinine,U: 106.6 mg/dL
MICROALB UR: 0.9 mg/dL (ref 0.0–1.9)
Microalb Creat Ratio: 0.8 mg/g (ref 0.0–30.0)

## 2017-04-02 LAB — HEPATIC FUNCTION PANEL
ALT: 11 U/L (ref 0–35)
AST: 15 U/L (ref 0–37)
Albumin: 4.2 g/dL (ref 3.5–5.2)
Alkaline Phosphatase: 72 U/L (ref 39–117)
Bilirubin, Direct: 0.1 mg/dL (ref 0.0–0.3)
Total Bilirubin: 0.8 mg/dL (ref 0.2–1.2)
Total Protein: 6.4 g/dL (ref 6.0–8.3)

## 2017-04-02 LAB — HEMOGLOBIN A1C: Hgb A1c MFr Bld: 7 % — ABNORMAL HIGH (ref 4.6–6.5)

## 2017-04-02 LAB — LIPID PANEL
CHOLESTEROL: 175 mg/dL (ref 0–200)
HDL: 52.2 mg/dL (ref >39.00)
LDL Cholesterol: 108 mg/dL — ABNORMAL HIGH (ref 0–99)
NONHDL: 122.55
Total CHOL/HDL Ratio: 3
Triglycerides: 71 mg/dL (ref 0.0–149.0)
VLDL: 14.2 mg/dL (ref 0.0–40.0)

## 2017-04-02 LAB — BASIC METABOLIC PANEL
BUN: 17 mg/dL (ref 6–23)
CALCIUM: 9.6 mg/dL (ref 8.4–10.5)
CO2: 28 mEq/L (ref 19–32)
Chloride: 105 mEq/L (ref 96–112)
Creatinine, Ser: 0.57 mg/dL (ref 0.40–1.20)
GFR: 109.33 mL/min (ref >60.00)
Glucose, Bld: 140 mg/dL — ABNORMAL HIGH (ref 70–99)
POTASSIUM: 4.5 meq/L (ref 3.5–5.1)
SODIUM: 140 meq/L (ref 135–145)

## 2017-04-02 NOTE — Addendum Note (Signed)
Addended by: Kern ReapVEREEN, Olon Russ B on: 04/02/2017 09:13 AM   Modules accepted: Orders

## 2017-04-02 NOTE — Progress Notes (Signed)
Subjective:     Patient ID: Kelly Blake, female   DOB: 1941-01-16, 76 y.o.   MRN: [id number]  HPI Patient seen for Medicare subsequent annual wellness visit and medical follow-up.  She had right total knee replacement back in March is done extremely well since then. He has progressed very well with physical therapy. Ambulating without much difficulty. Last colonoscopy 10 years ago. She's had previous shingles vaccine. Needs Pneumovax. Other immunizations up-to-date.  Type 2 diabetes. History of good control. Needs microalbumin. Gets eye exams in August. No recent polyuria or polydipsia  Hyperlipidemia chew with pravastatin. No myalgias. She has history of hypertension and recent blood pressures been very stable off medication. She's had history of hypertension but blood pressure stable currently off medication. She had one episode of syncope over year ago and stopped her Benicar at that time. Blood pressure home fairly consistently around130/70.  No recent falls. No depression issues.  Past Medical History:  Diagnosis Date  . Allergy   . Arthritis   . Chicken pox   . Chronic kidney disease    stones  . Colon polyps   . Diabetes mellitus without complication (HCC)   . Diverticulitis   . Hypertension    Past Surgical History:  Procedure Laterality Date  . BREAST SURGERY     right breast biopsy-benign  . DILATION AND CURETTAGE OF UTERUS  2008  . diverticulitis  2006   with abcess  . KNEE ARTHROSCOPY  2003   torn meniscus  . TOTAL KNEE ARTHROPLASTY Right 11/26/2016   Procedure: RIGHT TOTAL KNEE ARTHROPLASTY;  Surgeon: Ollen GrossFrank Aluisio, MD;  Location: [facility];  Service: Orthopedics;  Laterality: Right;    reports that she has never smoked. She has never used smokeless tobacco. She reports that she drinks alcohol. She reports that she does not use drugs. family history includes Alzheimer's disease in her other; Cancer in her mother and other; Diabetes in her father and other; Stroke  in her other. No Known Allergies  1.  Risk factors based on Past Medical , Social, and Family history reviewed and as indicated above with no changes 2.  Limitations in physical activities None.  No recent falls. 3.  Depression/mood No active depression or anxiety issues 4.  Hearing No defiits 5.  ADLs independent in all. 6.  Cognitive function (orientation to time and place, language, writing, speech,memory) no short or long term memory issues.  Language and judgement intact. 7.  Home Safety no issues 8.  Height, weight, and visual acuity.all stable.  Very mild weight loss following her surgery but fair appetite 9.  Counseling discussed -discussed advanced directives. She has living will and medical power of attorney in place 10. Recommendation of preventive services. Pneumovax given. Continue with yearly flu vaccine and mammogram. Discuss further colon cancer screening as below 11. Labs based on risk factors-lipid, hepatic, basic metabolic panel, CBC, A1c, urine microalbumin 12. Care Plan-as above 13. Other Providers-Dr. Randa [facility], Dr. Merlyn [facility] 14. Written schedule of screening/prevention services given to patient.   Review of Systems  Constitutional: Negative for activity change, appetite change, fatigue, fever and unexpected weight change.  HENT: Negative for ear pain, hearing loss, sore throat and trouble swallowing.   Eyes: Negative for visual disturbance.  Respiratory: Negative for cough and shortness of breath.   Cardiovascular: Negative for chest pain and palpitations.  Gastrointestinal: Negative for abdominal pain, blood in stool, constipation and diarrhea.  Genitourinary: Negative for dysuria and hematuria.  Musculoskeletal: Negative for arthralgias,  back pain and myalgias.  Skin: Negative for rash.  Neurological: Negative for dizziness, syncope and headaches.  Hematological: Negative for adenopathy.  Psychiatric/Behavioral: Negative for confusion and dysphoric  mood.       Objective:   Physical Exam  Constitutional: She is oriented to person, place, and time. She appears well-developed and well-nourished.  HENT:  Head: Normocephalic and atraumatic.  Eyes: Pupils are equal, round, and reactive to light. EOM are normal.  Neck: Normal range of motion. Neck supple. No thyromegaly present.  Cardiovascular: Normal rate, regular rhythm and normal heart sounds.   No murmur heard. Pulmonary/Chest: Breath sounds normal. No respiratory distress. She has no wheezes. She has no rales.  Abdominal: Soft. Bowel sounds are normal. She exhibits no distension and no mass. There is no tenderness. There is no rebound and no guarding.  Musculoskeletal: Normal range of motion. She exhibits no edema.  Lymphadenopathy:    She has no cervical adenopathy.  Neurological: She is alert and oriented to person, place, and time. She displays normal reflexes. No cranial nerve deficit.  Skin: No rash noted.  Psychiatric: She has a normal mood and affect. Her behavior is normal. Judgment and thought content normal.       Assessment:     #1 Medicare subsequent annual wellness visit  #2 hyperlipidemia  #3 type 2 diabetes with history of good control  #4 postoperative anemia with hemoglobin 9.8 following surgery. Denies any lightheadedness or orthostatic symptoms now.  #5 past history of hypertension currently borderline by reading here but controlled at home    Plan:     -Check further labs with A1c, urine microalbumin, CBC, lipid, hepatic, basic metabolic panel -Discussed further colon cancer screening. She will consider repeat colonoscopy versus possibly DNA base stool testing. She is undecided at this point. Information given regarding Cologuard -She plans to continue with yearly mammogram as her mother had breast cancer and died age 77 from complications  Kelly CoveyBruce Blake Kelly [name] [facility]

## 2017-04-02 NOTE — Patient Instructions (Signed)
Health Maintenance  Topic Date Due  . PNA vac Low Risk Adult (2 of 2 - PPSV23) 12/22/2015  . URINE MICROALBUMIN  01/12/2017  . INFLUENZA VACCINE  04/03/2017  . OPHTHALMOLOGY EXAM  04/11/2017  . HEMOGLOBIN A1C  05/10/2017  . FOOT EXAM  04/02/2018  . TETANUS/TDAP  06/20/2021  . DEXA SCAN  Completed   Consider new shingles vaccine (Shingrix)..  May wish to check on insurance coverage Consider repeat colonoscopy vs Cologuard.  Let us know if you want to pursue Cologuard Set up repeat eye exam this year.

## 2017-04-03 ENCOUNTER — Telehealth: Payer: Self-pay | Admitting: Family Medicine

## 2017-04-03 NOTE — Telephone Encounter (Signed)
Patient is aware of lab results and an appointment was made.

## 2017-04-03 NOTE — Telephone Encounter (Signed)
Kelly Blake pt returned your call °

## 2017-04-22 ENCOUNTER — Other Ambulatory Visit: Payer: Self-pay | Admitting: *Deleted

## 2017-04-22 MED ORDER — PRAVASTATIN SODIUM 20 MG PO TABS: 20.0000 mg | ORAL_TABLET | Freq: Every day | ORAL | 1 refills | Status: DC

## 2017-06-28 ENCOUNTER — Ambulatory Visit (INDEPENDENT_AMBULATORY_CARE_PROVIDER_SITE_OTHER): Payer: Medicare Other

## 2017-06-28 DIAGNOSIS — Z23 Encounter for immunization: Secondary | ICD-10-CM

## 2017-07-06 ENCOUNTER — Other Ambulatory Visit: Payer: Self-pay | Admitting: Family Medicine

## 2017-07-09 ENCOUNTER — Ambulatory Visit (INDEPENDENT_AMBULATORY_CARE_PROVIDER_SITE_OTHER): Payer: Medicare Other | Admitting: Family Medicine

## 2017-07-09 ENCOUNTER — Encounter: Payer: Self-pay | Admitting: Family Medicine

## 2017-07-09 VITALS — BP 140/90 | HR 70 | Temp 98.2°F | Wt 130.6 lb

## 2017-07-09 DIAGNOSIS — R030 Elevated blood-pressure reading, without diagnosis of hypertension: Secondary | ICD-10-CM

## 2017-07-09 DIAGNOSIS — E119 Type 2 diabetes mellitus without complications: Secondary | ICD-10-CM | POA: Diagnosis not present

## 2017-07-09 LAB — POCT GLYCOSYLATED HEMOGLOBIN (HGB A1C): HEMOGLOBIN A1C: 6.1

## 2017-07-09 NOTE — Patient Instructions (Signed)
Let's plan on follow up in about 3 weeks and bring your cuff with you.  Your A1C is improved at 6.1%.

## 2017-07-09 NOTE — Progress Notes (Signed)
Subjective:     Patient ID: Kelly Blake, female   DOB: [birth year], 76 y.o.   MRN: [id number]  HPI Patient is here today for follow-up regarding type 2 diabetes. She is seen back in August with A1c of 7.0% which was increased from her previous baseline. She currently takes metformin 500 mg once daily. No history of any complications from diabetes. She also has dyslipidemia treated with pravastatin.  She had recent knee replacement and has done well in recovery from that  Past Medical History:  Diagnosis Date  . Allergy   . Arthritis   . Chicken pox   . Chronic kidney disease    stones  . Colon polyps   . Diabetes mellitus without complication (HCC)   . Diverticulitis   . Hypertension    Past Surgical History:  Procedure Laterality Date  . BREAST SURGERY     right breast biopsy-benign  . DILATION AND CURETTAGE OF UTERUS  2008  . diverticulitis  2006   with abcess  . KNEE ARTHROSCOPY  2003   torn meniscus    reports that  has never smoked. she has never used smokeless tobacco. She reports that she drinks alcohol. She reports that she does not use drugs. family history includes Alzheimer's disease in her other; Cancer in her mother and other; Diabetes in her father and other; Stroke in her other. No Known Allergies   Review of Systems  Constitutional: Negative for fatigue.  Eyes: Negative for visual disturbance.  Respiratory: Negative for cough, chest tightness, shortness of breath and wheezing.   Cardiovascular: Negative for chest pain, palpitations and leg swelling.  Endocrine: Negative for polydipsia and polyuria.  Neurological: Negative for dizziness, seizures, syncope, weakness, light-headedness and headaches.       Objective:   Physical Exam  Constitutional: She appears well-developed and well-nourished.  Eyes: Pupils are equal, round, and reactive to light.  Neck: Neck supple. No JVD present. No thyromegaly present.  Cardiovascular: Normal rate and regular  rhythm. Exam reveals no gallop.  Pulmonary/Chest: Effort normal and breath sounds normal. No respiratory distress. She has no wheezes. She has no rales.  Musculoskeletal: She exhibits no edema.  Neurological: She is alert.       Assessment:     Type 2 diabetes.  Improved with A1c today 6.1%  Elevated blood pressure currently not treated. Patient states she has history of "white coat syndrome "    Plan:     -Return in 3 weeks and bring her blood pressure cuff here along with readings -Continue regular exercise habits  Kristian CoveyBruce W Real Cona MD [facility]

## 2017-07-31 LAB — HM MAMMOGRAPHY

## 2017-09-16 ENCOUNTER — Encounter: Payer: Self-pay | Admitting: Family Medicine

## 2017-09-21 DIAGNOSIS — M25562 Pain in left knee: Secondary | ICD-10-CM | POA: Diagnosis not present

## 2017-10-07 ENCOUNTER — Other Ambulatory Visit: Payer: Self-pay | Admitting: *Deleted

## 2017-10-07 ENCOUNTER — Telehealth: Payer: Self-pay | Admitting: Family Medicine

## 2017-10-07 MED ORDER — METFORMIN HCL 500 MG PO TABS: ORAL_TABLET | ORAL | 0 refills | Status: DC

## 2017-10-07 NOTE — Telephone Encounter (Signed)
Copied from CRM [id number]. Topic: Inquiry >> Oct 07, 2017  1:14 PM Yvonna Alanisobinson, Andra M wrote: Reason for CRM: Patient called requesting a refill of MetFORMIN (GLUCOPHAGE) 500 MG tablet. Patient's preferred pharmacy is  [id number] [place], [place] - [address] [phone] (Phone) [phone] (Fax).       Thank You!!!

## 2017-10-15 ENCOUNTER — Telehealth: Payer: Self-pay | Admitting: Family Medicine

## 2017-10-15 MED ORDER — PRAVASTATIN SODIUM 20 MG PO TABS: 20.0000 mg | ORAL_TABLET | Freq: Every day | ORAL | 1 refills | Status: DC

## 2017-10-15 NOTE — Telephone Encounter (Signed)
Copied from CRM [id number]. Topic: Quick Communication - Rx Refill/Question >> Oct 15, 2017 12:43 PM Crist InfanteHarrald, Kathy J wrote: Medication:  pravastatin (PRAVACHOL) 20 MG tablet  Pt has new insurance and needs to get at local pharmacy. used to get form [facility] 90 day Please send to [id number] [place], [place] - [address] [phone] (Phone) [phone] (Fax)

## 2017-11-21 DIAGNOSIS — M1711 Unilateral primary osteoarthritis, right knee: Secondary | ICD-10-CM | POA: Diagnosis not present

## 2017-12-26 DIAGNOSIS — E119 Type 2 diabetes mellitus without complications: Secondary | ICD-10-CM | POA: Diagnosis not present

## 2017-12-26 DIAGNOSIS — H2513 Age-related nuclear cataract, bilateral: Secondary | ICD-10-CM | POA: Diagnosis not present

## 2017-12-26 DIAGNOSIS — H401132 Primary open-angle glaucoma, bilateral, moderate stage: Secondary | ICD-10-CM | POA: Diagnosis not present

## 2017-12-26 LAB — HM DIABETES EYE EXAM

## 2017-12-30 ENCOUNTER — Encounter: Payer: Self-pay | Admitting: Family Medicine

## 2018-01-07 ENCOUNTER — Other Ambulatory Visit: Payer: Self-pay | Admitting: Family Medicine

## 2018-01-08 ENCOUNTER — Telehealth: Payer: Self-pay | Admitting: Family Medicine

## 2018-01-08 NOTE — Telephone Encounter (Signed)
Copied from CRM [id number]. Topic: Quick Communication - Rx Refill/Question >> Jan 08, 2018  3:40 PM Oneal Grout wrote: Medication: metFORMIN (GLUCOPHAGE) 500 MG tablet and pravastatin (PRAVACHOL) 20 MG tablet  requesting 90 day supply(pharmacy did not receive refill from 01/07/18) Has the patient contacted their pharmacy? Yes.   (Agent: If no, request that the patient contact the pharmacy for the refill.) Preferred Pharmacy (with phone number or street name): [facility] [address] Agent: Please be advised that RX refills may take up to 3 business days. We ask that you follow-up with your pharmacy.

## 2018-01-09 NOTE — Telephone Encounter (Signed)
[facility] and spoke to [place], [facility] who verified receipt of Metformin on 01/07/18 and says the patient has 1 refill left of Pravastatin, but will not be able to pick up until 01/12/18. Patient called, left VM of the above, advised to contact her pharmacy and call the office back if she has questions or concerns about this message.

## 2018-03-27 DIAGNOSIS — H401132 Primary open-angle glaucoma, bilateral, moderate stage: Secondary | ICD-10-CM | POA: Diagnosis not present

## 2018-04-07 ENCOUNTER — Other Ambulatory Visit: Payer: Self-pay | Admitting: Family Medicine

## 2018-04-08 ENCOUNTER — Other Ambulatory Visit: Payer: Self-pay | Admitting: Family Medicine

## 2018-05-04 ENCOUNTER — Other Ambulatory Visit: Payer: Self-pay | Admitting: Family Medicine

## 2018-05-29 ENCOUNTER — Emergency Department (HOSPITAL_COMMUNITY): Payer: Medicare HMO

## 2018-05-29 ENCOUNTER — Other Ambulatory Visit: Payer: Self-pay

## 2018-05-29 ENCOUNTER — Encounter (HOSPITAL_COMMUNITY): Payer: Self-pay | Admitting: Obstetrics and Gynecology

## 2018-05-29 ENCOUNTER — Emergency Department (HOSPITAL_COMMUNITY)
Admission: EM | Admit: 2018-05-29 | Discharge: 2018-05-30 | Disposition: A | Discharge status: Home / Self Care | Payer: Medicare HMO | Attending: Emergency Medicine | Admitting: Emergency Medicine

## 2018-05-29 DIAGNOSIS — Y939 Activity, unspecified: Secondary | ICD-10-CM | POA: Diagnosis not present

## 2018-05-29 DIAGNOSIS — R233 Spontaneous ecchymoses: Secondary | ICD-10-CM | POA: Diagnosis not present

## 2018-05-29 DIAGNOSIS — W19XXXA Unspecified fall, initial encounter: Secondary | ICD-10-CM

## 2018-05-29 DIAGNOSIS — M549 Dorsalgia, unspecified: Secondary | ICD-10-CM | POA: Diagnosis not present

## 2018-05-29 DIAGNOSIS — R51 Headache: Secondary | ICD-10-CM | POA: Insufficient documentation

## 2018-05-29 DIAGNOSIS — S7001XA Contusion of right hip, initial encounter: Secondary | ICD-10-CM | POA: Insufficient documentation

## 2018-05-29 DIAGNOSIS — W109XXA Fall (on) (from) unspecified stairs and steps, initial encounter: Secondary | ICD-10-CM | POA: Insufficient documentation

## 2018-05-29 DIAGNOSIS — R42 Dizziness and giddiness: Secondary | ICD-10-CM | POA: Diagnosis not present

## 2018-05-29 DIAGNOSIS — E119 Type 2 diabetes mellitus without complications: Secondary | ICD-10-CM | POA: Diagnosis not present

## 2018-05-29 DIAGNOSIS — I10 Essential (primary) hypertension: Secondary | ICD-10-CM | POA: Diagnosis not present

## 2018-05-29 DIAGNOSIS — Y999 Unspecified external cause status: Secondary | ICD-10-CM | POA: Diagnosis not present

## 2018-05-29 DIAGNOSIS — T07XXXA Unspecified multiple injuries, initial encounter: Secondary | ICD-10-CM | POA: Diagnosis present

## 2018-05-29 DIAGNOSIS — Z79899 Other long term (current) drug therapy: Secondary | ICD-10-CM | POA: Diagnosis not present

## 2018-05-29 DIAGNOSIS — S79911A Unspecified injury of right hip, initial encounter: Secondary | ICD-10-CM | POA: Diagnosis not present

## 2018-05-29 DIAGNOSIS — Y929 Unspecified place or not applicable: Secondary | ICD-10-CM | POA: Diagnosis not present

## 2018-05-29 DIAGNOSIS — R58 Hemorrhage, not elsewhere classified: Secondary | ICD-10-CM

## 2018-05-29 DIAGNOSIS — Z7982 Long term (current) use of aspirin: Secondary | ICD-10-CM | POA: Insufficient documentation

## 2018-05-29 DIAGNOSIS — S0003XA Contusion of scalp, initial encounter: Secondary | ICD-10-CM | POA: Diagnosis not present

## 2018-05-29 DIAGNOSIS — M25551 Pain in right hip: Secondary | ICD-10-CM | POA: Diagnosis not present

## 2018-05-29 LAB — BASIC METABOLIC PANEL
Anion gap: 11 (ref 5–15)
BUN: 23 mg/dL (ref 8–23)
CHLORIDE: 106 mmol/L (ref 98–111)
CO2: 24 mmol/L (ref 22–32)
CREATININE: 0.73 mg/dL (ref 0.44–1.00)
Calcium: 9.7 mg/dL (ref 8.9–10.3)
GFR calc Af Amer: >60 mL/min (ref >60)
GLUCOSE: 188 mg/dL — AB (ref 70–99)
Potassium: 3.7 mmol/L (ref 3.5–5.1)
Sodium: 141 mmol/L (ref 135–145)

## 2018-05-29 LAB — HEPATIC FUNCTION PANEL
ALT: 17 U/L (ref 0–44)
AST: 27 U/L (ref 15–41)
Albumin: 4.1 g/dL (ref 3.5–5.0)
Alkaline Phosphatase: 67 U/L (ref 38–126)
BILIRUBIN DIRECT: 0.2 mg/dL (ref 0.0–0.2)
BILIRUBIN INDIRECT: 0.8 mg/dL (ref 0.3–0.9)
Total Bilirubin: 1 mg/dL (ref 0.3–1.2)
Total Protein: 6.6 g/dL (ref 6.5–8.1)

## 2018-05-29 LAB — CBC
HEMATOCRIT: 37.6 % (ref 36.0–46.0)
HEMOGLOBIN: 12.4 g/dL (ref 12.0–15.0)
MCH: 31.2 pg (ref 26.0–34.0)
MCHC: 33 g/dL (ref 30.0–36.0)
MCV: 94.5 fL (ref 78.0–100.0)
Platelets: 307 10*3/uL (ref 150–400)
RBC: 3.98 MIL/uL (ref 3.87–5.11)
RDW: 13.4 % (ref 11.5–15.5)
WBC: 11.7 10*3/uL — AB (ref 4.0–10.5)

## 2018-05-29 MED ORDER — IBUPROFEN 800 MG PO TABS
800.0000 mg | ORAL_TABLET | Freq: Once | ORAL | Status: AC
  Administered 2018-05-29: 800 mg via ORAL
  Filled 2018-05-29: qty 1

## 2018-05-29 MED ORDER — ACETAMINOPHEN 500 MG PO TABS
1000.0000 mg | ORAL_TABLET | Freq: Once | ORAL | Status: AC
  Administered 2018-05-29: 1000 mg via ORAL
  Filled 2018-05-29: qty 2

## 2018-05-29 MED ORDER — LIDOCAINE 5 % EX PTCH
2.0000 | MEDICATED_PATCH | CUTANEOUS | Status: DC
  Administered 2018-05-29: 2 via TRANSDERMAL
  Filled 2018-05-29: qty 2

## 2018-05-29 NOTE — ED Provider Notes (Signed)
[facility]-EMERGENCY DEPT Provider Note   CSN: [id number] Arrival date & time: 05/29/18  2048     History   Chief Complaint Chief Complaint  Patient presents with  . Fall  . Headache  . Back Pain  . Dizziness    HPI Kelly Blake is a 77 y.o. female.  The history is provided by the patient.  Fall  This is a new problem. The current episode started 3 to 5 hours ago. The problem occurs constantly. The problem has not changed since onset.Pertinent negatives include no chest pain and no shortness of breath. Nothing aggravates the symptoms. Nothing relieves the symptoms. She has tried nothing for the symptoms. The treatment provided no relief.  Fall when she tripped on the stairs now with posterior head pain and posterior right hip pain with large ecchymosis.    Past Medical History:  Diagnosis Date  . Allergy   . Arthritis   . Chicken pox   . Chronic kidney disease    stones  . Colon polyps   . Diabetes mellitus without complication (HCC)   . Diverticulitis   . Hypertension     Patient Active Problem List   Diagnosis Date Noted  . OA (osteoarthritis) of knee 11/26/2016  . Type 2 diabetes mellitus, controlled (HCC) 12/22/2014  . Glaucoma 12/22/2014  . Postmenopausal 12/22/2014  . Hyperlipidemia 12/24/2012  . Hypertension 04/05/2011  . Type 2 diabetes mellitus (HCC) 04/05/2011    Past Surgical History:  Procedure Laterality Date  . BREAST SURGERY     right breast biopsy-benign  . DILATION AND CURETTAGE OF UTERUS  2008  . diverticulitis  2006   with abcess  . KNEE ARTHROSCOPY  2003   torn meniscus  . TOTAL KNEE ARTHROPLASTY Right 11/26/2016   Procedure: RIGHT TOTAL KNEE ARTHROPLASTY;  Surgeon: Ollen Gross, MD;  Location: [facility];  Service: Orthopedics;  Laterality: Right;     OB History   None      Home Medications    Prior to Admission medications   Medication Sig Start Date End Date Taking? Authorizing Provider  aspirin EC  81 MG tablet Take 81 mg by mouth daily.    [provider]  brimonidine-timolol (COMBIGAN) 0.2-0.5 % ophthalmic solution Place 1 drop into both eyes every 12 (twelve) hours.    [provider]  Cholecalciferol (VITAMIN D3) 10000 units TABS Take by mouth.    [provider]  cyanocobalamin 500 MCG tablet Take 500 mcg daily by mouth.    [provider]  metFORMIN (GLUCOPHAGE) 500 MG tablet TAKE 1 TABLET BY MOUTH EVERY DAY BEFORE BREAKFAST 05/06/18   Burchette, Elberta Fortis, MD  pravastatin (PRAVACHOL) 20 MG tablet TAKE 1 TABLET BY MOUTH EVERY DAY 04/08/18   Burchette, Elberta Fortis, MD  zinc gluconate 50 MG tablet Take 50 mg by mouth daily.    [provider]    Family History Family History  Problem Relation Age of Onset  . Cancer Other        breast  . Stroke Other   . Diabetes Other   . Alzheimer's disease Other   . Cancer Mother   . Diabetes Father     Social History Social History   Tobacco Use  . Smoking status: Never Smoker  . Smokeless tobacco: Never Used  Substance Use Topics  . Alcohol use: Yes    Comment: wine- 1 glass  daily or a , shot burbon daily occassionally  . Drug use: No  Allergies   Patient has no known allergies.   Review of Systems Review of Systems  Eyes: Negative for visual disturbance.  Respiratory: Negative for shortness of breath.   Cardiovascular: Negative for chest pain.  Musculoskeletal: Positive for arthralgias. Negative for back pain.  Neurological: Negative for dizziness, tremors, seizures, syncope, facial asymmetry, speech difficulty, weakness, light-headedness and numbness.  All other systems reviewed and are negative.    Physical Exam Updated Vital Signs BP 131/82   Pulse 84   Temp 97.9 F (36.6 C) (Oral)   Resp 16   SpO2 96%   Physical Exam  Constitutional: She is oriented to person, place, and time. She appears well-developed and well-nourished. No distress.  HENT:  Head: Normocephalic  and atraumatic.  Right Ear: External ear normal.  Left Ear: External ear normal.  Nose: Nose normal.  Mouth/Throat: Oropharynx is clear and moist. No oropharyngeal exudate.  Eyes: Pupils are equal, round, and reactive to light. Conjunctivae are normal.  Neck: Normal range of motion. Neck supple.  Cardiovascular: Normal rate, regular rhythm, normal heart sounds and intact distal pulses.  Pulmonary/Chest: Effort normal and breath sounds normal. No stridor. She has no wheezes. She has no rales.  Abdominal: Soft. Bowel sounds are normal. She exhibits no mass. There is no tenderness. There is no rebound and no guarding.  Musculoskeletal: Normal range of motion.  Neurological: She is alert and oriented to person, place, and time. She displays normal reflexes.  Skin: Skin is warm and dry. Capillary refill takes less than 2 seconds.  Psychiatric: She has a normal mood and affect.  Nursing note and vitals reviewed.    ED Treatments / Results  Labs (all labs ordered are listed, but only abnormal results are displayed) Results for orders placed or performed during the hospital encounter of 05/29/18  Basic metabolic panel  Result Value Ref Range   Sodium 141 135 - 145 mmol/L   Potassium 3.7 3.5 - 5.1 mmol/L   Chloride 106 98 - 111 mmol/L   CO2 24 22 - 32 mmol/L   Glucose, Bld 188 (H) 70 - 99 mg/dL   BUN 23 8 - 23 mg/dL   Creatinine, Ser 7.82 0.44 - 1.00 mg/dL   Calcium 9.7 8.9 - 95.6 mg/dL   GFR calc non Af Amer >60 >60 mL/min   GFR calc Af Amer >60 >60 mL/min   Anion gap 11 5 - 15  CBC  Result Value Ref Range   WBC 11.7 (H) 4.0 - 10.5 K/uL   RBC 3.98 3.87 - 5.11 MIL/uL   Hemoglobin 12.4 12.0 - 15.0 g/dL   HCT 21.3 08.6 - 57.8 %   MCV 94.5 78.0 - 100.0 fL   MCH 31.2 26.0 - 34.0 pg   MCHC 33.0 30.0 - 36.0 g/dL   RDW 46.9 62.9 - 52.8 %   Platelets 307 150 - 400 K/uL  Hepatic function panel  Result Value Ref Range   Total Protein 6.6 6.5 - 8.1 g/dL   Albumin 4.1 3.5 - 5.0 g/dL    AST 27 15 - 41 U/L   ALT 17 0 - 44 U/L   Alkaline Phosphatase 67 38 - 126 U/L   Total Bilirubin 1.0 0.3 - 1.2 mg/dL   Bilirubin, Direct 0.2 0.0 - 0.2 mg/dL   Indirect Bilirubin 0.8 0.3 - 0.9 mg/dL   Ct Head Wo Contrast  Result Date: 05/29/2018 CLINICAL DATA:  Patient fell hitting right parietal region on cement. EXAM: CT HEAD WITHOUT CONTRAST TECHNIQUE:  Contiguous axial images were obtained from the base of the skull through the vertex without intravenous contrast. COMPARISON:  None. FINDINGS: Brain: Minimal small vessel ischemic disease of periventricular white matter. No acute intracranial hemorrhage, midline shift or edema. No intra-axial mass nor extra-axial fluid collections. Vascular: No hyperdense vessel or unexpected calcification. Skull: Normal. Negative for fracture or focal lesion. Sinuses/Orbits: No acute finding. Other: Right posterior parietal scalp contusion with soft tissue swelling. IMPRESSION: Right posterior parietal scalp contusion. No acute intracranial abnormality or skull fracture. Electronically Signed   By: Tollie Eth M.D.   On: 05/29/2018 22:59   Dg Hip Unilat  With Pelvis 2-3 Views Right  Result Date: 05/29/2018 CLINICAL DATA:  Initial evaluation for acute trauma, fall. Acute right hip pain and pelvic pain. EXAM: DG HIP (WITH OR WITHOUT PELVIS) 2-3V RIGHT COMPARISON:  None. FINDINGS: No acute fracture or dislocation. Femoral heads in normal alignment within the acetabula. Femoral head heights maintained. Bony pelvis intact. Moderate osteoarthritic changes about the right hip. Prominent degenerative changes noted within the lower lumbar spine. No acute soft tissue abnormality. 12 mm calcific density overlying the right lower quadrant noted, of doubtful significance in the acute setting. IMPRESSION: 1. No acute osseous abnormality. 2. 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 Electronically Signed   By: Rise Mu M.D.   On: 05/29/2018 22:44    Radiology Ct Head Wo Contrast  Result Date: 05/29/2018 CLINICAL DATA:  Patient fell hitting right parietal region on cement. EXAM: CT HEAD WITHOUT CONTRAST TECHNIQUE: Contiguous axial images were obtained from the base of the skull through the vertex without intravenous contrast. COMPARISON:  None. FINDINGS: Brain: Minimal small vessel ischemic disease of periventricular white matter. No acute intracranial hemorrhage, midline shift or edema. No intra-axial mass nor extra-axial fluid collections. Vascular: No hyperdense vessel or unexpected calcification. Skull: Normal. Negative for fracture or focal lesion. Sinuses/Orbits: No acute finding. Other: Right posterior parietal scalp contusion with soft tissue swelling. IMPRESSION: Right posterior parietal scalp contusion. No acute intracranial abnormality or skull fracture. Electronically Signed   By: Tollie Eth M.D.   On: 05/29/2018 22:59   Dg Hip Unilat  With Pelvis 2-3 Views Right  Result Date: 05/29/2018 CLINICAL DATA:  Initial evaluation for acute trauma, fall. Acute right hip pain and pelvic pain. EXAM: DG HIP (WITH OR WITHOUT PELVIS) 2-3V RIGHT COMPARISON:  None. FINDINGS: No acute fracture or dislocation. Femoral heads in normal alignment within the acetabula. Femoral head heights maintained. Bony pelvis intact. Moderate osteoarthritic changes about the right hip. Prominent degenerative changes noted within the lower lumbar spine. No acute soft tissue abnormality. 12 mm calcific density overlying the right lower quadrant noted, of doubtful significance in the acute setting. IMPRESSION: 1. No acute osseous abnormality. 2. 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 Electronically Signed   By: Rise Mu M.D.   On: 05/29/2018 22:44    Procedures Procedures (including critical care time)  Medications Ordered in ED Medications  lidocaine (LIDODERM) 5 % 2 patch (2 patches Transdermal Patch Applied 05/29/18 2332)   acetaminophen (TYLENOL) tablet 1,000 mg (1,000 mg Oral Given 05/29/18 2332)  ibuprofen (ADVIL,MOTRIN) tablet 800 mg (800 mg Oral Given 05/29/18 2332)     Final Clinical Impressions(s) / ED Diagnoses   Return for weakness, numbness, changes in vision or speech, fevers >100.4 unrelieved by medication, shortness of breath, intractable vomiting, or diarrhea, abdominal pain, Inability to tolerate liquids or food, cough, altered mental status or any concerns. No signs of systemic illness  or infection. The patient is nontoxic-appearing on exam and vital signs are within normal limits.    I have reviewed the triage vital signs and the nursing notes. Pertinent labs &imaging results that were available during my care of the patient were reviewed by me and considered in my medical decision making (see chart for details).  After history, exam, and medical workup I feel the patient has been appropriately medically screened and is safe for discharge home. Pertinent diagnoses were discussed with the patient. Patient was given return precautions.   Tinia Oravec, MD 05/30/18 0001

## 2018-05-29 NOTE — ED Triage Notes (Signed)
Pt reports she fell off 4 steps and his her head on some bricks and hit her back. Pt reports she had no LOC but reports head pain and dizziness. Pt reports she is not on any blood thinners

## 2018-05-30 ENCOUNTER — Other Ambulatory Visit: Payer: Self-pay | Admitting: Family Medicine

## 2018-05-30 MED ORDER — LIDOCAINE 5 % EX PTCH: 1.0000 | MEDICATED_PATCH | CUTANEOUS | 0 refills | Status: DC

## 2018-06-10 ENCOUNTER — Other Ambulatory Visit: Payer: Self-pay

## 2018-06-10 ENCOUNTER — Ambulatory Visit (INDEPENDENT_AMBULATORY_CARE_PROVIDER_SITE_OTHER): Payer: Medicare HMO

## 2018-06-10 ENCOUNTER — Ambulatory Visit (INDEPENDENT_AMBULATORY_CARE_PROVIDER_SITE_OTHER): Payer: Medicare HMO | Admitting: Family Medicine

## 2018-06-10 ENCOUNTER — Encounter: Payer: Self-pay | Admitting: Family Medicine

## 2018-06-10 VITALS — BP 142/78 | HR 80 | Temp 98.0°F | Wt 138.4 lb

## 2018-06-10 DIAGNOSIS — E119 Type 2 diabetes mellitus without complications: Secondary | ICD-10-CM

## 2018-06-10 DIAGNOSIS — S0093XA Contusion of unspecified part of head, initial encounter: Secondary | ICD-10-CM

## 2018-06-10 DIAGNOSIS — S300XXA Contusion of lower back and pelvis, initial encounter: Secondary | ICD-10-CM | POA: Diagnosis not present

## 2018-06-10 DIAGNOSIS — Z23 Encounter for immunization: Secondary | ICD-10-CM | POA: Diagnosis not present

## 2018-06-10 LAB — POCT GLYCOSYLATED HEMOGLOBIN (HGB A1C): Hemoglobin A1C: 6 % — AB (ref 4.0–5.6)

## 2018-06-10 NOTE — Patient Instructions (Signed)

## 2018-06-10 NOTE — Progress Notes (Signed)
Subjective:     Patient ID: Kelly Blake, female   DOB: 06-08-1941, 77 y.o.   MRN: [id number]  HPI Patient seen for ER follow-up.  Injury occurred on 05/29/2018.  She was going down some stairs and fell off of her porch as she was carrying a couple birdfeeders.  She fell down and apparently landed striking posterior occiput region and parietal region as well as right sacral region.  She was able to ambulate afterwards.  There was no loss of consciousness.  She was seen in the ER and had x-rays including CT of the head which showed no acute abnormality.  She had diagnostic unilateral hip with pelvis and this showed no acute bony abnormality.  Patient has a large hematoma and large area of discoloration right buttock since then.  She has resumed exercise and is ambulating without pain.  Denies any other injuries.  No headaches.  No confusion.  No dizziness.  Type 2 diabetes.  She has not had A1c in quite some time.  Remains on metformin.  Past Medical History:  Diagnosis Date  . Allergy   . Arthritis   . Chicken pox   . Chronic kidney disease    stones  . Colon polyps   . Diabetes mellitus without complication (HCC)   . Diverticulitis   . Hypertension    Past Surgical History:  Procedure Laterality Date  . BREAST SURGERY     right breast biopsy-benign  . DILATION AND CURETTAGE OF UTERUS  2008  . diverticulitis  2006   with abcess  . KNEE ARTHROSCOPY  2003   torn meniscus  . TOTAL KNEE ARTHROPLASTY Right 11/26/2016   Procedure: RIGHT TOTAL KNEE ARTHROPLASTY;  Surgeon: Ollen Gross, MD;  Location: [facility];  Service: Orthopedics;  Laterality: Right;    reports that she has never smoked. She has never used smokeless tobacco. She reports that she drinks alcohol. She reports that she does not use drugs. family history includes Alzheimer's disease in her other; Cancer in her mother and other; Diabetes in her father and other; Stroke in her other. No Known Allergies   Review of Systems   Constitutional: Negative for fatigue.  Eyes: Negative for visual disturbance.  Respiratory: Negative for cough, chest tightness, shortness of breath and wheezing.   Cardiovascular: Negative for chest pain, palpitations and leg swelling.  Neurological: Negative for dizziness, seizures, syncope, weakness, light-headedness and headaches.       Objective:   Physical Exam  Constitutional: She appears well-developed and well-nourished.  Eyes: Pupils are equal, round, and reactive to light.  Neck: Neck supple. No JVD present. No thyromegaly present.  Cardiovascular: Normal rate and regular rhythm. Exam reveals no gallop.  Pulmonary/Chest: Effort normal and breath sounds normal. No respiratory distress. She has no wheezes. She has no rales.  Musculoskeletal: She exhibits no edema.  Patient has a large hematoma which is approximately 9 x 6 cm right sacral region.  She has diffuse ecchymosis involving pretty much all of her right buttock.  Hematoma is soft and nontender.  No overlying erythema  Neurological: She is alert.       Assessment:     #1 status post recent fall with head contusion and large hematoma right sacral region but no fractures and no acute abnormalities on CT of head  #2 history of type 2 diabetes which has been well controlled in the past    Plan:     -Reassurance regarding hematoma.  We explained this will take several  months to fully resolve.  Discoloration of buttock should resolve over the next several weeks -Recheck A1c -Consider routine follow-up in a few months to reassess  Kristian Covey MD [facility]

## 2018-06-26 ENCOUNTER — Other Ambulatory Visit: Payer: Self-pay | Admitting: Family Medicine

## 2018-07-17 DIAGNOSIS — H2513 Age-related nuclear cataract, bilateral: Secondary | ICD-10-CM | POA: Diagnosis not present

## 2018-07-17 DIAGNOSIS — H401132 Primary open-angle glaucoma, bilateral, moderate stage: Secondary | ICD-10-CM | POA: Diagnosis not present

## 2018-08-04 DIAGNOSIS — Z1231 Encounter for screening mammogram for malignant neoplasm of breast: Secondary | ICD-10-CM | POA: Diagnosis not present

## 2018-08-04 DIAGNOSIS — Z803 Family history of malignant neoplasm of breast: Secondary | ICD-10-CM | POA: Diagnosis not present

## 2018-08-04 LAB — HM MAMMOGRAPHY

## 2018-08-18 ENCOUNTER — Other Ambulatory Visit: Payer: Self-pay | Admitting: Family Medicine

## 2018-10-01 ENCOUNTER — Other Ambulatory Visit: Payer: Self-pay | Admitting: Family Medicine

## 2018-10-06 DIAGNOSIS — H401132 Primary open-angle glaucoma, bilateral, moderate stage: Secondary | ICD-10-CM | POA: Diagnosis not present

## 2018-10-10 ENCOUNTER — Other Ambulatory Visit: Payer: Self-pay | Admitting: Family Medicine

## 2018-11-01 ENCOUNTER — Other Ambulatory Visit: Payer: Self-pay | Admitting: Family Medicine

## 2018-11-03 DIAGNOSIS — H2513 Age-related nuclear cataract, bilateral: Secondary | ICD-10-CM | POA: Diagnosis not present

## 2018-11-03 DIAGNOSIS — H2511 Age-related nuclear cataract, right eye: Secondary | ICD-10-CM | POA: Diagnosis not present

## 2018-11-03 DIAGNOSIS — H04123 Dry eye syndrome of bilateral lacrimal glands: Secondary | ICD-10-CM | POA: Diagnosis not present

## 2018-11-03 DIAGNOSIS — H25013 Cortical age-related cataract, bilateral: Secondary | ICD-10-CM | POA: Diagnosis not present

## 2018-11-03 DIAGNOSIS — E119 Type 2 diabetes mellitus without complications: Secondary | ICD-10-CM | POA: Diagnosis not present

## 2018-11-03 DIAGNOSIS — H401132 Primary open-angle glaucoma, bilateral, moderate stage: Secondary | ICD-10-CM | POA: Diagnosis not present

## 2018-11-27 ENCOUNTER — Other Ambulatory Visit: Payer: Self-pay | Admitting: Family Medicine

## 2018-12-04 NOTE — Telephone Encounter (Signed)
Has an appointment on Friday, 12/05/18

## 2018-12-05 ENCOUNTER — Other Ambulatory Visit: Payer: Self-pay

## 2018-12-05 ENCOUNTER — Ambulatory Visit (INDEPENDENT_AMBULATORY_CARE_PROVIDER_SITE_OTHER): Payer: Medicare HMO | Admitting: Family Medicine

## 2018-12-05 DIAGNOSIS — E1136 Type 2 diabetes mellitus with diabetic cataract: Secondary | ICD-10-CM | POA: Diagnosis not present

## 2018-12-05 DIAGNOSIS — E785 Hyperlipidemia, unspecified: Secondary | ICD-10-CM

## 2018-12-05 MED ORDER — PRAVASTATIN SODIUM 20 MG PO TABS: 20.0000 mg | ORAL_TABLET | Freq: Every day | ORAL | 3 refills | Status: DC

## 2018-12-05 MED ORDER — METFORMIN HCL 500 MG PO TABS: ORAL_TABLET | ORAL | 3 refills | Status: DC

## 2018-12-05 NOTE — Progress Notes (Addendum)
Patient ID: Kelly Blake, female   DOB: 1941-03-01, 78 y.o.   MRN: [id number]  Virtual Visit via Video Note  I connected with Carmelina Noun on 12/05/18 at 11:00 AM EDT by a video enabled telemedicine application and verified that I am speaking with the correct person using two identifiers.  Location patient: home Location provider:work or home office Persons participating in the virtual visit: patient, provider  I discussed the limitations of evaluation and management by telemedicine and the availability of in person appointments. The patient expressed understanding and agreed to proceed.   HPI: We attempted video WebEx but patient was unable to get on for technical reasons.  She has type 2 diabetes.  She has cataracts and is trying to get scheduled for surgery for that.  Also history of glaucoma.  She takes metformin 500 mg daily.  A1c 6.0% last fall.  She does not monitor blood sugars regularly.  No polyuria or polydipsia  Hyperlipidemia treated with pravastatin.  She is due for labs.  Denies any side effects from medication.   ROS: See pertinent positives and negatives per HPI.  Past Medical History:  Diagnosis Date  . Allergy   . Arthritis   . Chicken pox   . Chronic kidney disease    stones  . Colon polyps   . Diabetes mellitus without complication (HCC)   . Diverticulitis   . Hypertension     Past Surgical History:  Procedure Laterality Date  . BREAST SURGERY     right breast biopsy-benign  . DILATION AND CURETTAGE OF UTERUS  2008  . diverticulitis  2006   with abcess  . KNEE ARTHROSCOPY  2003   torn meniscus  . TOTAL KNEE ARTHROPLASTY Right 11/26/2016   Procedure: RIGHT TOTAL KNEE ARTHROPLASTY;  Surgeon: Ollen Gross, MD;  Location: [facility];  Service: Orthopedics;  Laterality: Right;    Family History  Problem Relation Age of Onset  . Cancer Other        breast  . Stroke Other   . Diabetes Other   . Alzheimer's disease Other   . Cancer Mother   .  Diabetes Father     SOCIAL HX: Non-smoker   Current Outpatient Medications:  .  aspirin EC 81 MG tablet, Take 81 mg by mouth daily., Disp: , Rfl:  .  brimonidine-timolol (COMBIGAN) 0.2-0.5 % ophthalmic solution, Place 1 drop into both eyes every 12 (twelve) hours., Disp: , Rfl:  .  Cholecalciferol (VITAMIN D3) 10000 units TABS, Take by mouth., Disp: , Rfl:  .  cyanocobalamin 500 MCG tablet, Take 500 mcg daily by mouth., Disp: , Rfl:  .  metFORMIN (GLUCOPHAGE) 500 MG tablet, Take one tablet by mouth daily, Disp: 90 tablet, Rfl: 3 .  pravastatin (PRAVACHOL) 20 MG tablet, Take 1 tablet (20 mg total) by mouth daily., Disp: 90 tablet, Rfl: 3 .  zinc gluconate 50 MG tablet, Take 50 mg by mouth daily., Disp: , Rfl:   EXAM:  VITALS per patient if applicable:  GENERAL: alert, oriented, appears well and in no acute distress  HEENT: atraumatic, conjunttiva clear, no obvious abnormalities on inspection of external nose and ears  NECK: normal movements of the head and neck  LUNGS: on inspection no signs of respiratory distress, breathing rate appears normal, no obvious gross SOB, gasping or wheezing  CV: no obvious cyanosis  MS: moves all visible extremities without noticeable abnormality  PSYCH/NEURO: pleasant and cooperative, no obvious depression or anxiety, speech and thought processing grossly  intact  ASSESSMENT AND PLAN:  Discussed the following assessment and plan:  #1 type 2 diabetes with complications of cataracts.  Pending surgery for that. -Overdue for labs but will wait until current COVID-19 pandemic has settled down -Refilled her Metformin  #2 dyslipidemia treated with pravastatin -Refilled pravastatin and we will plan on getting follow-up lipid and hepatic panel hopefully within 3 or 4 months     I discussed the assessment and treatment plan with the patient. The patient was provided an opportunity to ask questions and all were answered. The patient agreed with the  plan and demonstrated an understanding of the instructions.   The patient was advised to call back or seek an in-person evaluation if the symptoms worsen or if the condition fails to improve as anticipated.  15 minutes were spent with this visit.    [name], MD

## 2019-01-19 DIAGNOSIS — H2513 Age-related nuclear cataract, bilateral: Secondary | ICD-10-CM | POA: Diagnosis not present

## 2019-01-19 DIAGNOSIS — H401132 Primary open-angle glaucoma, bilateral, moderate stage: Secondary | ICD-10-CM | POA: Diagnosis not present

## 2019-01-29 DIAGNOSIS — R69 Illness, unspecified: Secondary | ICD-10-CM | POA: Diagnosis not present

## 2019-02-12 DIAGNOSIS — H401112 Primary open-angle glaucoma, right eye, moderate stage: Secondary | ICD-10-CM | POA: Diagnosis not present

## 2019-02-12 DIAGNOSIS — H2511 Age-related nuclear cataract, right eye: Secondary | ICD-10-CM | POA: Diagnosis not present

## 2019-02-12 DIAGNOSIS — H401111 Primary open-angle glaucoma, right eye, mild stage: Secondary | ICD-10-CM | POA: Diagnosis not present

## 2019-02-13 DIAGNOSIS — H2512 Age-related nuclear cataract, left eye: Secondary | ICD-10-CM | POA: Diagnosis not present

## 2019-02-24 DIAGNOSIS — R69 Illness, unspecified: Secondary | ICD-10-CM | POA: Diagnosis not present

## 2019-03-04 DIAGNOSIS — H2512 Age-related nuclear cataract, left eye: Secondary | ICD-10-CM | POA: Diagnosis not present

## 2019-03-05 DIAGNOSIS — H2512 Age-related nuclear cataract, left eye: Secondary | ICD-10-CM | POA: Diagnosis not present

## 2019-03-05 DIAGNOSIS — H401122 Primary open-angle glaucoma, left eye, moderate stage: Secondary | ICD-10-CM | POA: Diagnosis not present

## 2019-04-07 DIAGNOSIS — R69 Illness, unspecified: Secondary | ICD-10-CM | POA: Diagnosis not present

## 2019-04-28 DIAGNOSIS — R69 Illness, unspecified: Secondary | ICD-10-CM | POA: Diagnosis not present

## 2019-05-06 ENCOUNTER — Ambulatory Visit (INDEPENDENT_AMBULATORY_CARE_PROVIDER_SITE_OTHER): Payer: Medicare HMO | Admitting: *Deleted

## 2019-05-06 ENCOUNTER — Other Ambulatory Visit: Payer: Self-pay

## 2019-05-06 DIAGNOSIS — Z23 Encounter for immunization: Secondary | ICD-10-CM | POA: Diagnosis not present

## 2019-07-22 ENCOUNTER — Telehealth: Payer: Self-pay | Admitting: Family Medicine

## 2019-07-22 ENCOUNTER — Telehealth: Payer: Self-pay

## 2019-07-22 NOTE — Telephone Encounter (Signed)
Please see message. °

## 2019-07-22 NOTE — Telephone Encounter (Signed)
Suggest that she set up Doxy - possibly with Dr Maudie Mercury tomorrow- or with me on Friday.  Would offer tomorrow so as not to further delay.

## 2019-07-22 NOTE — Telephone Encounter (Signed)
   Copied from [place] [id number]. Topic: General - Inquiry >> Jul 22, 2019  4:59 PM Rainey Pines A wrote: Patient would like a callback once medication for her cough and nasal drip has been called in

## 2019-07-22 NOTE — Telephone Encounter (Signed)
Pt is experiencing nasal drip and a cough and has been taking over the counter but it has not helped much. Pt asked if Dr. Elease Hashimoto can call something in for this/ Please advise

## 2019-07-23 NOTE — Telephone Encounter (Signed)
Pt calling to check on this again.  States she was up most of the night with cough and wants to know if PCP will be able to call something in for her.

## 2019-07-23 NOTE — Telephone Encounter (Signed)
Patient returned Jamie's call.  She has been taking some OTC cough medication and states that it seems to working because her cough seems better.  She didn't want an appointment with Dr. Maudie Mercury today, but stated that if didn't rest well tonight, she would call back in the morning to be seen virtually by Dr. Elease Hashimoto tomorrow.

## 2019-07-23 NOTE — Telephone Encounter (Signed)
Clinic RN called patient on both numbers listed. No answer.   

## 2019-07-23 NOTE — Telephone Encounter (Signed)
Clinic RN called patient on both numbers listed. No answer.

## 2019-08-10 DIAGNOSIS — Z1231 Encounter for screening mammogram for malignant neoplasm of breast: Secondary | ICD-10-CM | POA: Diagnosis not present

## 2019-08-10 DIAGNOSIS — Z803 Family history of malignant neoplasm of breast: Secondary | ICD-10-CM | POA: Diagnosis not present

## 2019-08-10 LAB — HM MAMMOGRAPHY

## 2019-11-17 ENCOUNTER — Other Ambulatory Visit: Payer: Self-pay | Admitting: Family Medicine

## 2020-01-26 ENCOUNTER — Telehealth: Payer: Self-pay | Admitting: Family Medicine

## 2020-01-26 NOTE — Telephone Encounter (Signed)
The patient has had both of her COVID vaccines and needs them updated in her chart so [place] will quit calling her to get her scheduled for the vaccines.  She had the [facility] Vaccines on:  09/15/2019 - 1st dose 10/13/2019 - 2nd dose

## 2020-01-26 NOTE — Telephone Encounter (Signed)
This has been updated.

## 2020-02-02 ENCOUNTER — Ambulatory Visit (INDEPENDENT_AMBULATORY_CARE_PROVIDER_SITE_OTHER): Payer: Medicare HMO | Admitting: Family Medicine

## 2020-02-02 ENCOUNTER — Other Ambulatory Visit: Payer: Self-pay

## 2020-02-02 ENCOUNTER — Encounter: Payer: Self-pay | Admitting: Family Medicine

## 2020-02-02 VITALS — BP 122/64 | HR 74 | Temp 97.7°F | Ht 60.0 in | Wt 133.6 lb

## 2020-02-02 DIAGNOSIS — E1136 Type 2 diabetes mellitus with diabetic cataract: Secondary | ICD-10-CM

## 2020-02-02 DIAGNOSIS — L853 Xerosis cutis: Secondary | ICD-10-CM | POA: Diagnosis not present

## 2020-02-02 DIAGNOSIS — E785 Hyperlipidemia, unspecified: Secondary | ICD-10-CM

## 2020-02-02 DIAGNOSIS — I10 Essential (primary) hypertension: Secondary | ICD-10-CM | POA: Diagnosis not present

## 2020-02-02 DIAGNOSIS — Z0000 Encounter for general adult medical examination without abnormal findings: Secondary | ICD-10-CM

## 2020-02-02 LAB — CBC WITH DIFFERENTIAL/PLATELET
Basophils Absolute: 0 10*3/uL (ref 0.0–0.1)
Basophils Relative: 0.6 % (ref 0.0–3.0)
Eosinophils Absolute: 0.3 10*3/uL (ref 0.0–0.7)
Eosinophils Relative: 6 % — ABNORMAL HIGH (ref 0.0–5.0)
HCT: 38.6 % (ref 36.0–46.0)
Hemoglobin: 13.2 g/dL (ref 12.0–15.0)
Lymphocytes Relative: 23.5 % (ref 12.0–46.0)
Lymphs Abs: 1.1 10*3/uL (ref 0.7–4.0)
MCHC: 34.2 g/dL (ref 30.0–36.0)
MCV: 95.2 fl (ref 78.0–100.0)
Monocytes Absolute: 0.4 10*3/uL (ref 0.1–1.0)
Monocytes Relative: 8.9 % (ref 3.0–12.0)
Neutro Abs: 2.9 10*3/uL (ref 1.4–7.7)
Neutrophils Relative %: 61 % (ref 43.0–77.0)
Platelets: 265 10*3/uL (ref 150.0–400.0)
RBC: 4.05 Mil/uL (ref 3.87–5.11)
RDW: 13.5 % (ref 11.5–15.5)
WBC: 4.7 10*3/uL (ref 4.0–10.5)

## 2020-02-02 LAB — BASIC METABOLIC PANEL
BUN: 19 mg/dL (ref 6–23)
CO2: 27 mEq/L (ref 19–32)
Calcium: 9.7 mg/dL (ref 8.4–10.5)
Chloride: 104 mEq/L (ref 96–112)
Creatinine, Ser: 0.63 mg/dL (ref 0.40–1.20)
GFR: 90.98 mL/min (ref >60.00)
Glucose, Bld: 136 mg/dL — ABNORMAL HIGH (ref 70–99)
Potassium: 4.3 mEq/L (ref 3.5–5.1)
Sodium: 139 mEq/L (ref 135–145)

## 2020-02-02 LAB — HEPATIC FUNCTION PANEL
ALT: 13 U/L (ref 0–35)
AST: 18 U/L (ref 0–37)
Albumin: 4.6 g/dL (ref 3.5–5.2)
Alkaline Phosphatase: 70 U/L (ref 39–117)
Bilirubin, Direct: 0.2 mg/dL (ref 0.0–0.3)
Total Bilirubin: 1 mg/dL (ref 0.2–1.2)
Total Protein: 6.8 g/dL (ref 6.0–8.3)

## 2020-02-02 LAB — LIPID PANEL
Cholesterol: 181 mg/dL (ref 0–200)
HDL: 65.3 mg/dL (ref >39.00)
LDL Cholesterol: 103 mg/dL — ABNORMAL HIGH (ref 0–99)
NonHDL: 116.01
Total CHOL/HDL Ratio: 3
Triglycerides: 65 mg/dL (ref 0.0–149.0)
VLDL: 13 mg/dL (ref 0.0–40.0)

## 2020-02-02 LAB — HEMOGLOBIN A1C: Hgb A1c MFr Bld: 6.9 % — ABNORMAL HIGH (ref 4.6–6.5)

## 2020-02-02 LAB — MICROALBUMIN / CREATININE URINE RATIO
Creatinine,U: 139.7 mg/dL
Microalb Creat Ratio: 1.4 mg/g (ref 0.0–30.0)
Microalb, Ur: 2 mg/dL — ABNORMAL HIGH (ref 0.0–1.9)

## 2020-02-02 LAB — TSH: TSH: 2.1 u[IU]/mL (ref 0.35–4.50)

## 2020-02-02 MED ORDER — PRAVASTATIN SODIUM 20 MG PO TABS: 20.0000 mg | ORAL_TABLET | Freq: Every day | ORAL | 3 refills | Status: DC

## 2020-02-02 MED ORDER — METFORMIN HCL 500 MG PO TABS: ORAL_TABLET | ORAL | 3 refills | Status: DC

## 2020-02-02 MED ORDER — TRIAMCINOLONE ACETONIDE 0.5 % EX CREA: 1.0000 "application " | TOPICAL_CREAM | Freq: Two times a day (BID) | CUTANEOUS | 1 refills | Status: DC | PRN

## 2020-02-02 NOTE — Patient Instructions (Signed)
Preventive Care 80 Years and Older, Female Preventive care refers to lifestyle choices and visits with your health care provider that can promote health and wellness. This includes:  A yearly physical exam. This is also called an annual well check.  Regular dental and eye exams.  Immunizations.  Screening for certain conditions.  Healthy lifestyle choices, such as diet and exercise. What can I expect for my preventive care visit? Physical exam Your health care provider will check:  Height and weight. These may be used to calculate body mass index (BMI), which is a measurement that tells if you are at a healthy weight.  Heart rate and blood pressure.  Your skin for abnormal spots. Counseling Your health care provider may ask you questions about:  Alcohol, tobacco, and drug use.  Emotional well-being.  Home and relationship well-being.  Sexual activity.  Eating habits.  History of falls.  Memory and ability to understand (cognition).  Work and work Statistician.  Pregnancy and menstrual history. What immunizations do I need?  Influenza (flu) vaccine  This is recommended every year. Tetanus, diphtheria, and pertussis (Tdap) vaccine  You may need a Td booster every 10 years. Varicella (chickenpox) vaccine  You may need this vaccine if you have not already been vaccinated. Zoster (shingles) vaccine  You may need this after age 80. Pneumococcal conjugate (PCV13) vaccine  One dose is recommended after age 80. Pneumococcal polysaccharide (PPSV23) vaccine  One dose is recommended after age 72. Measles, mumps, and rubella (MMR) vaccine  You may need at least one dose of MMR if you were born in 1957 or later. You may also need a second dose. Meningococcal conjugate (MenACWY) vaccine  You may need this if you have certain conditions. Hepatitis A vaccine  You may need this if you have certain conditions or if you travel or work in places where you may be exposed  to hepatitis A. Hepatitis B vaccine  You may need this if you have certain conditions or if you travel or work in places where you may be exposed to hepatitis B. Haemophilus influenzae type b (Hib) vaccine  You may need this if you have certain conditions. You may receive vaccines as individual doses or as more than one vaccine together in one shot (combination vaccines). Talk with your health care provider about the risks and benefits of combination vaccines. What tests do I need? Blood tests  Lipid and cholesterol levels. These may be checked every 5 years, or more frequently depending on your overall health.  Hepatitis C test.  Hepatitis B test. Screening  Lung cancer screening. You may have this screening every year starting at age 80 if you have a 30-pack-year history of smoking and currently smoke or have quit within the past 15 years.  Colorectal cancer screening. All adults should have this screening starting at age 80 and continuing until age 80. Your health care provider may recommend screening at age 23 if you are at increased risk. You will have tests every 1-10 years, depending on your results and the type of screening test.  Diabetes screening. This is done by checking your blood sugar (glucose) after you have not eaten for a while (fasting). You may have this done every 1-3 years.  Mammogram. This may be done every 1-2 years. Talk with your health care provider about how often you should have regular mammograms.  BRCA-related cancer screening. This may be done if you have a family history of breast, ovarian, tubal, or peritoneal cancers.  Other tests  Sexually transmitted disease (STD) testing.  Bone density scan. This is done to screen for osteoporosis. You may have this done starting at age 80. Follow these instructions at home: Eating and drinking  Eat a diet that includes fresh fruits and vegetables, whole grains, lean protein, and low-fat dairy products. Limit  your intake of foods with high amounts of sugar, saturated fats, and salt.  Take vitamin and mineral supplements as recommended by your health care provider.  Do not drink alcohol if your health care provider tells you not to drink.  If you drink alcohol: ? Limit how much you have to 0-1 drink a day. ? Be aware of how much alcohol is in your drink. In the [place] one drink equals one 12 oz bottle of beer (355 mL), one 5 oz glass of wine (148 mL), or one 1 oz glass of hard liquor (44 mL). Lifestyle  Take daily care of your teeth and gums.  Stay active. Exercise for at least 30 minutes on 5 or more days each week.  Do not use any products that contain nicotine or tobacco, such as cigarettes, e-cigarettes, and chewing tobacco. If you need help quitting, ask your health care provider.  If you are sexually active, practice safe sex. Use a condom or other form of protection in order to prevent STIs (sexually transmitted infections).  Talk with your health care provider about taking a low-dose aspirin or statin. What's next?  Go to your health care provider once a year for a well check visit.  Ask your health care provider how often you should have your eyes and teeth checked.  Stay up to date on all vaccines. This information is not intended to replace advice given to you by your health care provider. Make sure you discuss any questions you have with your health care provider. Document Revised: 08/14/2018 Document Reviewed: 08/14/2018 Elsevier Patient Education  [place].  Set up eye exam at some point this year

## 2020-02-02 NOTE — Progress Notes (Signed)
Subjective:     Patient ID: Kelly Blake, female   DOB: 10-Sep-1940, 79 y.o.   MRN: [id number]  HPI Kelly Blake is seen for physical exam and for separate new item to be addressed as below.  She has history of type 2 diabetes and hyperlipidemia.  She does not check blood sugars regularly.  Last A1c was back in October 2019 at 6.0.  No recent polyuria or polydipsia.  She has had previous cataract surgery but has not had diabetic eye exam in the past year.  No neuropathy symptoms.  She had mammogram last fall which was normal.  She has not had colonoscopy in over 10 years but has decided against further colonoscopies.  She takes pravastatin and Metformin and needs refills of both.  She has received Covid vaccine.  Will need tetanus booster next year.  Gets annual flu vaccine.  Previous pneumonia vaccines already given  She has pruritic rash left lower back region and also smaller patch left leg.  Present for 3 weeks at least.  Nonpainful.  Moderate pruritus.  She tried some leftover 0.025% triamcinolone cream with some mild relief but still has considerable itching.  Recent change of soap or detergent.  Wt Readings from Last 3 Encounters:  02/02/20 133 lb 9.6 oz (60.6 kg)  06/10/18 138 lb 6.4 oz (62.8 kg)  07/09/17 130 lb 9.6 oz (59.2 kg)   Health Maintenance  Topic Date Due  . URINE MICROALBUMIN  04/02/2018  . HEMOGLOBIN A1C  12/10/2018  . OPHTHALMOLOGY EXAM  12/27/2018  . INFLUENZA VACCINE  04/03/2020  . FOOT EXAM  02/01/2021  . TETANUS/TDAP  06/20/2021  . DEXA SCAN  Completed  . COVID-19 Vaccine  Completed  . PNA vac Low Risk Adult  Completed      Past Medical History:  Diagnosis Date  . Allergy   . Arthritis   . Chicken pox   . Chronic kidney disease    stones  . Colon polyps   . Diabetes mellitus without complication (HCC)   . Diverticulitis   . Hypertension    Past Surgical History:  Procedure Laterality Date  . BREAST SURGERY     right breast biopsy-benign  .  DILATION AND CURETTAGE OF UTERUS  2008  . diverticulitis  2006   with abcess  . KNEE ARTHROSCOPY  2003   torn meniscus  . TOTAL KNEE ARTHROPLASTY Right 11/26/2016   Procedure: RIGHT TOTAL KNEE ARTHROPLASTY;  Surgeon: Ollen Gross, MD;  Location: [facility];  Service: Orthopedics;  Laterality: Right;    reports that she has never smoked. She has never used smokeless tobacco. She reports current alcohol use. She reports that she does not use drugs. family history includes Alzheimer's disease in an other family member; Cancer in her mother and another family member; Diabetes in her father and another family member; Stroke in an other family member. No Known Allergies   Review of Systems  Constitutional: Negative for activity change, appetite change, fatigue, fever and unexpected weight change.  HENT: Negative for ear pain, hearing loss, sore throat and trouble swallowing.   Eyes: Negative for visual disturbance.  Respiratory: Negative for cough and shortness of breath.   Cardiovascular: Negative for chest pain and palpitations.  Gastrointestinal: Negative for abdominal pain, blood in stool, constipation and diarrhea.  Endocrine: Negative for polydipsia and polyuria.  Genitourinary: Negative for dysuria and hematuria.  Musculoskeletal: Negative for arthralgias, back pain and myalgias.  Skin: Positive for rash. Negative for wound.  Neurological: Negative  for dizziness, syncope, weakness and headaches.  Hematological: Negative for adenopathy.  Psychiatric/Behavioral: Negative for confusion and dysphoric mood.       Objective:   Physical Exam Constitutional:      Appearance: She is well-developed.  Eyes:     Pupils: Pupils are equal, round, and reactive to light.  Neck:     Thyroid: No thyromegaly.     Vascular: No JVD.  Cardiovascular:     Rate and Rhythm: Normal rate and regular rhythm.     Heart sounds: No gallop.   Pulmonary:     Effort: Pulmonary effort is normal. No respiratory  distress.     Breath sounds: Normal breath sounds. No wheezing or rales.  Abdominal:     Palpations: Abdomen is soft.     Tenderness: There is no abdominal tenderness.  Musculoskeletal:     Cervical back: Neck supple.  Skin:    Findings: Rash present.     Comments: She has diffuse area left lower lumbar area which is dry and slightly excoriated.  No pustules.  No vesicles.  This is a fairly large area approximately 20 x 20 cm  Neurological:     Mental Status: She is alert.        Assessment:     #1 physical exam.  We discussed several health maintenance issues as below.  She has chronic problems including type 2 diabetes and hyperlipidemia.  Her type 2 diabetes has been well controlled for several years.  Does need follow-up eye exam  #2 dry skin dermatitis type rash mostly left lower back    Plan:     -Covid vaccines already given -We discussed colon cancer screening and she has decided against any further screening including Cologuard given her age of 73 and she will be 38 in a couple months -Recent mammogram normal -Set up diabetic eye exam -Continue annual flu vaccine -We discussed liberal use of over-the-counter moisturizers for her back rash and also will send in prescription for triamcinolone 0.5% cream to use once or twice daily as needed and touch base in 2 weeks if not improved -Obtain follow-up labs and include A1c and urine microalbumin with her diabetes history -We discussed follow-up DEXA scan as she has not had one since 2016 but she had excellent results at that time and declines repeat -Continue regular weightbearing exercise  Eulas Post MD [facility] at [facility]

## 2020-02-03 ENCOUNTER — Telehealth: Payer: Self-pay | Admitting: Family Medicine

## 2020-02-03 NOTE — Telephone Encounter (Signed)
[place] pt returned your call 

## 2020-02-03 NOTE — Telephone Encounter (Signed)
See result note.  

## 2020-02-04 ENCOUNTER — Telehealth: Payer: Self-pay | Admitting: Family Medicine

## 2020-02-04 DIAGNOSIS — Z1211 Encounter for screening for malignant neoplasm of colon: Secondary | ICD-10-CM

## 2020-02-04 NOTE — Telephone Encounter (Signed)
Pt would like to do the cologuard test and was not sure if she is able to order it herself or if PCP has to?   Pt is aware PCP is out Thursdays   Pt can be reached at [phone]

## 2020-02-05 NOTE — Telephone Encounter (Signed)
Lvm for pt to call back. 

## 2020-02-05 NOTE — Telephone Encounter (Signed)
Okay to order?

## 2020-02-05 NOTE — Telephone Encounter (Signed)
Okay to place order? °

## 2020-02-05 NOTE — Telephone Encounter (Signed)
Pt has been notified that cologuard order has been placed

## 2020-02-14 DIAGNOSIS — Z1211 Encounter for screening for malignant neoplasm of colon: Secondary | ICD-10-CM | POA: Diagnosis not present

## 2020-02-14 LAB — COLOGUARD: Cologuard: NEGATIVE

## 2020-02-19 ENCOUNTER — Encounter: Payer: Self-pay | Admitting: Family Medicine

## 2020-03-10 ENCOUNTER — Telehealth: Payer: Self-pay | Admitting: Family Medicine

## 2020-03-10 NOTE — Telephone Encounter (Signed)
Pt is calling to get her cologuard results stated that she received a letter from cologuard asking her to give the provider a call to get the results.

## 2020-03-11 NOTE — Telephone Encounter (Signed)
Pt given results that were negative nothing further needed

## 2020-03-11 NOTE — Telephone Encounter (Signed)
Lvm for pt to call back. 

## 2020-05-10 DIAGNOSIS — R69 Illness, unspecified: Secondary | ICD-10-CM | POA: Diagnosis not present

## 2020-05-11 ENCOUNTER — Other Ambulatory Visit: Payer: Self-pay | Admitting: Family Medicine

## 2020-05-11 MED ORDER — PRAVASTATIN SODIUM 20 MG PO TABS: 20.0000 mg | ORAL_TABLET | Freq: Every day | ORAL | 3 refills | Status: DC

## 2020-05-11 NOTE — Telephone Encounter (Signed)
Pt was given a year supply 02/2020. Pt should have refills. Called pt but no answer. Pt will need  to call pharmacy.

## 2020-05-11 NOTE — Telephone Encounter (Signed)
Rx sent in

## 2020-05-11 NOTE — Telephone Encounter (Signed)
Pt stated she will be out in a couple days of this medication   Medication Refill:  Pravastatin  Pharmacy: [id number] [place], [place] - [address] Phone:  [phone]  Fax:  [phone]

## 2020-05-11 NOTE — Addendum Note (Signed)
Addended by: Weyman Croon E on: 05/11/2020 05:30 PM   Modules accepted: Orders

## 2020-05-25 ENCOUNTER — Ambulatory Visit (INDEPENDENT_AMBULATORY_CARE_PROVIDER_SITE_OTHER): Payer: Medicare HMO

## 2020-05-25 ENCOUNTER — Other Ambulatory Visit: Payer: Self-pay

## 2020-05-25 DIAGNOSIS — Z0000 Encounter for general adult medical examination without abnormal findings: Secondary | ICD-10-CM | POA: Diagnosis not present

## 2020-05-25 NOTE — Progress Notes (Signed)
Subjective:   Kelly Blake is a 79 y.o. female who presents for Medicare Annual (Subsequent) preventive examination.  I connected with Carmelina Noun today by telephone and verified that I am speaking with the correct person using two identifiers. Location patient: home Location provider: work Persons participating in the virtual visit: patient, provider.   I discussed the limitations, risks, security and privacy concerns of performing an evaluation and management service by telephone and the availability of in person appointments. I also discussed with the patient that there may be a patient responsible charge related to this service. The patient expressed understanding and verbally consented to this telephonic visit.    Interactive audio and video telecommunications were attempted between this provider and patient, however failed, due to patient having technical difficulties OR patient did not have access to video capability.  We continued and completed visit with audio only.      Review of Systems    N/A Cardiac Risk Factors include: advanced age (>2men, >69 women)     Objective:    Today's Vitals   There is no height or weight on file to calculate BMI.  Advanced Directives 05/25/2020 11/26/2016 11/26/2016 11/26/2016 11/21/2016  Does Patient Have a Medical Advance Directive? Yes - Yes Yes Yes  Type of Estate agent of [place];Living will Healthcare Power of [place];Living will Healthcare Power of [place];Living will Healthcare Power of [place];Living will Healthcare Power of [place];Living will  Does patient want to make changes to medical advance directive? No - Patient declined No - Patient declined - - -  Copy of Healthcare Power of Attorney in Chart? Yes - validated most recent copy scanned in chart (See row information) Yes Yes Yes No - copy requested    Current Medications (verified) Outpatient Encounter Medications as of 05/25/2020    Medication Sig  . Cholecalciferol (VITAMIN D3) 10000 units TABS Take by mouth.  . cyanocobalamin 500 MCG tablet Take 500 mcg daily by mouth.  . metFORMIN (GLUCOPHAGE) 500 MG tablet Take one tablet once daily with breakfast  . pravastatin (PRAVACHOL) 20 MG tablet Take 1 tablet (20 mg total) by mouth daily.  Marland Kitchen zinc gluconate 50 MG tablet Take 50 mg by mouth daily.  Marland Kitchen aspirin EC 81 MG tablet Take 81 mg by mouth daily. (Patient not taking: Reported on 05/25/2020)  . brimonidine-timolol (COMBIGAN) 0.2-0.5 % ophthalmic solution Place 1 drop into both eyes every 12 (twelve) hours. (Patient not taking: Reported on 05/25/2020)  . triamcinolone cream (KENALOG) 0.5 % Apply 1 application topically 2 (two) times daily as needed. (Patient not taking: Reported on 05/25/2020)   No facility-administered encounter medications on file as of 05/25/2020.    Allergies (verified) Patient has no known allergies.   History: Past Medical History:  Diagnosis Date  . Allergy   . Arthritis   . Chicken pox   . Chronic kidney disease    stones  . Colon polyps   . Diabetes mellitus without complication (HCC)   . Diverticulitis   . Hypertension    Past Surgical History:  Procedure Laterality Date  . BREAST SURGERY     right breast biopsy-benign  . DILATION AND CURETTAGE OF UTERUS  2008  . diverticulitis  2006   with abcess  . KNEE ARTHROSCOPY  2003   torn meniscus  . 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 Cancer Other        breast  . Stroke Other   . Diabetes Other   . Alzheimer's disease Other   . Cancer Mother   . Diabetes Father    Social History   Socioeconomic History  . Marital status: Married    Spouse name: Not on file  . Number of children: Not on file  . Years of education: Not on file  . Highest  education level: Not on file  Occupational History  . Not on file  Tobacco Use  . Smoking status: Never Smoker  . Smokeless tobacco: Never Used  Vaping Use  . Vaping Use: Never used  Substance and Sexual Activity  . Alcohol use: Yes    Comment: wine- 1 glass  daily or a , shot burbon daily occassionally  . Drug use: No  . Sexual activity: Not on file  Other Topics Concern  . Not on file  Social History Narrative  . Not on file   Social Determinants of Health   Financial Resource Strain: Low Risk   . Difficulty of Paying Living Expenses: Not hard at all  Food Insecurity: No Food Insecurity  . Worried About Programme researcher, broadcasting/film/video in the Last Year: Never true  . Ran Out of Food in the Last Year: Never true  Transportation Needs: No Transportation Needs  . Lack of Transportation (Medical): No  . Lack of Transportation (Non-Medical): No  Physical Activity: Insufficiently Active  . Days of Exercise per Week: 2 days  . Minutes of Exercise per Session: 60 min  Stress: No Stress Concern Present  . Feeling of Stress : Not at all  Social Connections: Moderately Integrated  . Frequency of Communication with Friends and Family: More than three times a week  . Frequency of Social Gatherings with Friends and Family: Three times a week  . Attends Religious Services: More than 4 times per year  . Active Member of Clubs or Organizations: No  . Attends Banker Meetings: Never  . Marital Status: Married    Tobacco Counseling Counseling given: Not Answered   Clinical Intake:  Pre-visit preparation completed: Yes  Pain : No/denies pain     Nutritional Risks: None Diabetes: Yes (Patient states does not check glucose) CBG done?: No Did pt. bring in CBG monitor from home?: No  How often do you need to have someone help you when you read instructions, pamphlets, or other written materials from your doctor or pharmacy?: 1 - Never What is the last grade level you completed  in school?: Bachelors in Science  Diabetic?yes  Interpreter Needed?: No  Information entered by :: SCrews,LPN   Activities of Daily Living In your present state of health, do you have any difficulty performing the following activities: 05/25/2020  Hearing? N  Vision? N  Difficulty concentrating or making decisions? N  Walking or climbing stairs? N  Dressing or bathing? N  Doing errands, shopping? N  Preparing Food and eating ? N  Using the Toilet? N  In the past six months, have you accidently leaked urine? N  Do you have problems with loss of bowel control? N  Managing your Medications? N  Managing your Finances? N  Some recent data might be hidden    Patient Care Team: Kristian Covey, MD as PCP - General (Family Medicine)  Indicate any recent Medical Services you may have received from other than [facility] providers  in the past year (date may be approximate).     Assessment:   This is a routine wellness examination for Kelly Blake.  Hearing/Vision screen  Hearing Screening   125Hz  250Hz  500Hz  1000Hz  2000Hz  3000Hz  4000Hz  6000Hz  8000Hz   Right ear:           Left ear:           Vision Screening Comments: Patient states gets eyes checked yearly. Has had eye surgery 2 years ago.   Dietary issues and exercise activities discussed: Current Exercise Habits: Structured exercise class, Type of exercise: strength training/weights;walking;stretching, Time (Minutes): 60, Frequency (Times/Week): 2, Weekly Exercise (Minutes/Week): 120, Intensity: Moderate, Exercise limited by: None identified  Goals    . Patient Stated     I will continue to go to my exercise class Monday, and Wednesday for 1 hour      Depression Screen PHQ 2/9 Scores 05/25/2020 02/02/2020 06/10/2018 04/02/2017 06/29/2016 01/14/2016 01/13/2016  PHQ - 2 Score 0 0 0 0 0 0 0  PHQ- 9 Score 0 - - - - - -    Fall Risk Fall Risk  05/25/2020 02/02/2020 06/10/2018 04/02/2017 06/29/2016  Falls in the past year? 0 0 Yes No No  Number  falls in past yr: 0 0 1 - -  Injury with Fall? 0 0 Yes - -  Risk for fall due to : No Fall Risks - - - -  Follow up Falls evaluation completed;Falls prevention discussed Falls evaluation completed - - -    Any stairs in or around the home? Yes  If so, are there any without handrails? No  Home free of loose throw rugs in walkways, pet beds, electrical cords, etc? Yes  Adequate lighting in your home to reduce risk of falls? Yes   ASSISTIVE DEVICES UTILIZED TO PREVENT FALLS:  Life alert? No  Use of a cane, walker or w/c? No  Grab bars in the bathroom? Yes  Shower chair or bench in shower? No  Elevated toilet seat or a handicapped toilet? No   Cognitive Function:  Patient declined cognitive screening       Immunizations Immunization History  Administered Date(s) Administered  . Fluad Quad(high Dose 65+) 05/06/2019  . Influenza Split 06/21/2011, 05/21/2012  . Influenza, High Dose Seasonal PF 05/27/2015, 06/11/2016, 06/28/2017, 06/10/2018  . Influenza,inj,Quad PF,6+ Mos 06/03/2013, 06/04/2014  . Moderna SARS-COVID-2 Vaccination 09/15/2019, 10/13/2019  . Pneumococcal Conjugate-13 12/22/2014  . Pneumococcal Polysaccharide-23 04/02/2017  . Tdap 06/21/2011    TDAP status: Due, Education has been provided regarding the importance of this vaccine. Advised may receive this vaccine at local pharmacy or Health Dept. Aware to provide a copy of the vaccination record if obtained from local pharmacy or Health Dept. Verbalized acceptance and understanding. Flu Vaccine status: Up to date Pneumococcal vaccine status: Up to date Covid-19 vaccine status: Completed vaccines  Qualifies for Shingles Vaccine? Yes   Zostavax completed No   Shingrix Completed?: No.    Education has been provided regarding the importance of this vaccine. Patient has been advised to call insurance company to determine out of pocket expense if they have not yet received this vaccine. Advised may also receive vaccine at  local pharmacy or Health Dept. Verbalized acceptance and understanding.  Screening Tests Health Maintenance  Topic Date Due  . OPHTHALMOLOGY EXAM  12/27/2018  . INFLUENZA VACCINE  04/03/2020  . HEMOGLOBIN A1C  08/03/2020  . FOOT EXAM  02/01/2021  . URINE MICROALBUMIN  02/01/2021  . TETANUS/TDAP  06/20/2021  .  DEXA SCAN  Completed  . COVID-19 Vaccine  Completed  . PNA vac Low Risk Adult  Completed    Health Maintenance  Health Maintenance Due  Topic Date Due  . OPHTHALMOLOGY EXAM  12/27/2018  . INFLUENZA VACCINE  04/03/2020    Colorectal cancer screening: No longer required.  Mammogram status: Completed 03/22/2020. Repeat every year Bone Density status: Completed 12/22/2014. Results reflect: Bone density results: NORMAL. Repeat every 0 years.  Lung Cancer Screening: (Low Dose CT Chest recommended if Age 84-80 years, 30 pack-year currently smoking OR have quit w/in 15years.) does not qualify.   Lung Cancer Screening Referral: N/A  Additional Screening:  Hepatitis C Screening: does not qualify;   Vision Screening: Recommended annual ophthalmology exams for early detection of glaucoma and other disorders of the eye. Is the patient up to date with their annual eye exam?  Yes  Who is the provider or what is the name of the office in which the patient attends annual eye exams? Dr. Martha ClanHutto If pt is not established with a provider, would they like to be referred to a provider to establish care? No .   Dental Screening: Recommended annual dental exams for proper oral hygiene  Community Resource Referral / Chronic Care Management: CRR required this visit?  No   CCM required this visit?  No      Plan:     I have personally reviewed and noted the following in the patient's chart:   . Medical and social history . Use of alcohol, tobacco or illicit drugs  . Current medications and supplements . Functional ability and status . Nutritional status . Physical  activity . Advanced directives . List of other physicians . Hospitalizations, surgeries, and ER visits in previous 12 months . Vitals . Screenings to include cognitive, depression, and falls . Referrals and appointments  In addition, I have reviewed and discussed with patient certain preventive protocols, quality metrics, and best practice recommendations. A written personalized care plan for preventive services as well as general preventive health recommendations were provided to patient.     Theodora BlowShannon Josedaniel Haye, LPN   1/61/09609/22/2021   Nurse Notes: None

## 2020-05-25 NOTE — Patient Instructions (Signed)
Kelly Blake , Thank you for taking time to come for your Medicare Wellness Visit. I appreciate your ongoing commitment to your health goals. Please review the following plan we discussed and let me know if I can assist you in the future.   Screening recommendations/referrals: Colonoscopy: No longer required  Mammogram: up to date, next due 03/22/2021 Bone Density: No longer required Recommended yearly ophthalmology/optometry visit for glaucoma screening and checkup Recommended yearly dental visit for hygiene and checkup  Vaccinations: Influenza vaccine: Currently due, you may receive at your next office visit or at your local pharmacy Pneumococcal vaccine: Completed series Tdap vaccine: Currently due, you may await and injury or contact your insurance to discuss cost  Shingles vaccine: Currently due, for Shingrix, you may contact your pharmacy to discuss cost and to receive     Advanced directives: Copies on file  Conditions/risks identified: None   Next appointment: 07/26/2020 @ 9:30 am with Dr. Caryl Never   Preventive Care 80 Years and Older, Female Preventive care refers to lifestyle choices and visits with your health care provider that can promote health and wellness. What does preventive care include?  A yearly physical exam. This is also called an annual well check.  Dental exams once or twice a year.  Routine eye exams. Ask your health care provider how often you should have your eyes checked.  Personal lifestyle choices, including:  Daily care of your teeth and gums.  Regular physical activity.  Eating a healthy diet.  Avoiding tobacco and drug use.  Limiting alcohol use.  Practicing safe sex.  Taking low-dose aspirin every day.  Taking vitamin and mineral supplements as recommended by your health care provider. What happens during an annual well check? The services and screenings done by your health care provider during your annual well check will depend  on your age, overall health, lifestyle risk factors, and family history of disease. Counseling  Your health care provider may ask you questions about your:  Alcohol use.  Tobacco use.  Drug use.  Emotional well-being.  Home and relationship well-being.  Sexual activity.  Eating habits.  History of falls.  Memory and ability to understand (cognition).  Work and work Astronomer.  Reproductive health. Screening  You may have the following tests or measurements:  Height, weight, and BMI.  Blood pressure.  Lipid and cholesterol levels. These may be checked every 5 years, or more frequently if you are over 62 years old.  Skin check.  Lung cancer screening. You may have this screening every year starting at age 80 if you have a 30-pack-year history of smoking and currently smoke or have quit within the past 15 years.  Fecal occult blood test (FOBT) of the stool. You may have this test every year starting at age 80.  Flexible sigmoidoscopy or colonoscopy. You may have a sigmoidoscopy every 5 years or a colonoscopy every 10 years starting at age 80.  Hepatitis C blood test.  Hepatitis B blood test.  Sexually transmitted disease (STD) testing.  Diabetes screening. This is done by checking your blood sugar (glucose) after you have not eaten for a while (fasting). You may have this done every 1-3 years.  Bone density scan. This is done to screen for osteoporosis. You may have this done starting at age 80.  Mammogram. This may be done every 1-2 years. Talk to your health care provider about how often you should have regular mammograms. Talk with your health care provider about your test results, treatment options,  and if necessary, the need for more tests. Vaccines  Your health care provider may recommend certain vaccines, such as:  Influenza vaccine. This is recommended every year.  Tetanus, diphtheria, and acellular pertussis (Tdap, Td) vaccine. You may need a Td  booster every 10 years.  Zoster vaccine. You may need this after age 80.  Pneumococcal 13-valent conjugate (PCV13) vaccine. One dose is recommended after age 55.  Pneumococcal polysaccharide (PPSV23) vaccine. One dose is recommended after age 13. Talk to your health care provider about which screenings and vaccines you need and how often you need them. This information is not intended to replace advice given to you by your health care provider. Make sure you discuss any questions you have with your health care provider. Document Released: 09/16/2015 Document Revised: 05/09/2016 Document Reviewed: 06/21/2015 Elsevier Interactive Patient Education  2017 [place] Prevention in the Home Falls can cause injuries. They can happen to people of all ages. There are many things you can do to make your home safe and to help prevent falls. What can I do on the outside of my home?  Regularly fix the edges of walkways and driveways and fix any cracks.  Remove anything that might make you trip as you walk through a door, such as a raised step or threshold.  Trim any bushes or trees on the path to your home.  Use bright outdoor lighting.  Clear any walking paths of anything that might make someone trip, such as rocks or tools.  Regularly check to see if handrails are loose or broken. Make sure that both sides of any steps have handrails.  Any raised decks and porches should have guardrails on the edges.  Have any leaves, snow, or ice cleared regularly.  Use sand or salt on walking paths during winter.  Clean up any spills in your garage right away. This includes oil or grease spills. What can I do in the bathroom?  Use night lights.  Install grab bars by the toilet and in the tub and shower. Do not use towel bars as grab bars.  Use non-skid mats or decals in the tub or shower.  If you need to sit down in the shower, use a plastic, non-slip stool.  Keep the floor dry. Clean up  any water that spills on the floor as soon as it happens.  Remove soap buildup in the tub or shower regularly.  Attach bath mats securely with double-sided non-slip rug tape.  Do not have throw rugs and other things on the floor that can make you trip. What can I do in the bedroom?  Use night lights.  Make sure that you have a light by your bed that is easy to reach.  Do not use any sheets or blankets that are too big for your bed. They should not hang down onto the floor.  Have a firm chair that has side arms. You can use this for support while you get dressed.  Do not have throw rugs and other things on the floor that can make you trip. What can I do in the kitchen?  Clean up any spills right away.  Avoid walking on wet floors.  Keep items that you use a lot in easy-to-reach places.  If you need to reach something above you, use a strong step stool that has a grab bar.  Keep electrical cords out of the way.  Do not use floor polish or wax that makes floors slippery. If  you must use wax, use non-skid floor wax.  Do not have throw rugs and other things on the floor that can make you trip. What can I do with my stairs?  Do not leave any items on the stairs.  Make sure that there are handrails on both sides of the stairs and use them. Fix handrails that are broken or loose. Make sure that handrails are as long as the stairways.  Check any carpeting to make sure that it is firmly attached to the stairs. Fix any carpet that is loose or worn.  Avoid having throw rugs at the top or bottom of the stairs. If you do have throw rugs, attach them to the floor with carpet tape.  Make sure that you have a light switch at the top of the stairs and the bottom of the stairs. If you do not have them, ask someone to add them for you. What else can I do to help prevent falls?  Wear shoes that:  Do not have high heels.  Have rubber bottoms.  Are comfortable and fit you well.  Are  closed at the toe. Do not wear sandals.  If you use a stepladder:  Make sure that it is fully opened. Do not climb a closed stepladder.  Make sure that both sides of the stepladder are locked into place.  Ask someone to hold it for you, if possible.  Clearly mark and make sure that you can see:  Any grab bars or handrails.  First and last steps.  Where the edge of each step is.  Use tools that help you move around (mobility aids) if they are needed. These include:  Canes.  Walkers.  Scooters.  Crutches.  Turn on the lights when you go into a dark area. Replace any light bulbs as soon as they burn out.  Set up your furniture so you have a clear path. Avoid moving your furniture around.  If any of your floors are uneven, fix them.  If there are any pets around you, be aware of where they are.  Review your medicines with your doctor. Some medicines can make you feel dizzy. This can increase your chance of falling. Ask your doctor what other things that you can do to help prevent falls. This information is not intended to replace advice given to you by your health care provider. Make sure you discuss any questions you have with your health care provider. Document Released: 06/16/2009 Document Revised: 01/26/2016 Document Reviewed: 09/24/2014 Elsevier Interactive Patient Education  2017 [employer].

## 2020-05-31 DIAGNOSIS — R69 Illness, unspecified: Secondary | ICD-10-CM | POA: Diagnosis not present

## 2020-06-15 ENCOUNTER — Other Ambulatory Visit: Payer: Self-pay

## 2020-06-15 ENCOUNTER — Ambulatory Visit (INDEPENDENT_AMBULATORY_CARE_PROVIDER_SITE_OTHER): Payer: Medicare HMO

## 2020-06-15 DIAGNOSIS — Z23 Encounter for immunization: Secondary | ICD-10-CM | POA: Diagnosis not present

## 2020-07-26 ENCOUNTER — Ambulatory Visit (INDEPENDENT_AMBULATORY_CARE_PROVIDER_SITE_OTHER): Payer: Medicare HMO | Admitting: Family Medicine

## 2020-07-26 ENCOUNTER — Other Ambulatory Visit: Payer: Self-pay

## 2020-07-26 ENCOUNTER — Encounter: Payer: Self-pay | Admitting: Family Medicine

## 2020-07-26 VITALS — BP 180/80 | HR 68 | Temp 98.0°F | Ht 60.0 in | Wt 130.4 lb

## 2020-07-26 DIAGNOSIS — I10 Essential (primary) hypertension: Secondary | ICD-10-CM

## 2020-07-26 DIAGNOSIS — E1136 Type 2 diabetes mellitus with diabetic cataract: Secondary | ICD-10-CM | POA: Diagnosis not present

## 2020-07-26 LAB — POCT GLYCOSYLATED HEMOGLOBIN (HGB A1C): Hemoglobin A1C: 6.2 % — AB (ref 4.0–5.6)

## 2020-07-26 MED ORDER — AMLODIPINE BESYLATE 5 MG PO TABS: 5.0000 mg | ORAL_TABLET | Freq: Every day | ORAL | 3 refills | Status: DC

## 2020-07-26 NOTE — Progress Notes (Signed)
Established Patient Office Visit  Subjective:  Patient ID: Kelly Blake, female    DOB: 01-Apr-1941  Age: 79 y.o. MRN: [id number]  CC: No chief complaint on file.   HPI Kelly Blake presents for medical follow-up.  She had physical back in the summer.  We had obtained labs then which were mostly favorable but she did have A1c of 6.9%.  We suggested 46-month follow-up.  [facility]  She is not monitoring her blood sugars regularly.  No polyuria or polydipsia.  She remains on Metformin low dosage of 500 mg once daily with breakfast.  Also takes pravastatin 20 mg daily.  Recent urine microalbumin screen negative.  Denies any recent headaches or dizziness.  No recent exertional chest pains.  Blood pressure substantially elevated today and this has been atypical for her.  She has home blood pressure cuff but has not monitored recently.  Past Medical History:  Diagnosis Date   Allergy    Arthritis    Chicken pox    Chronic kidney disease    stones   Colon polyps    Diabetes mellitus without complication (HCC)    Diverticulitis    Hypertension     Past Surgical History:  Procedure Laterality Date   BREAST SURGERY     right breast biopsy-benign   DILATION AND CURETTAGE OF UTERUS  2008   diverticulitis  2006   with abcess   KNEE ARTHROSCOPY  2003   torn meniscus   TOTAL KNEE ARTHROPLASTY Right 11/26/2016   Procedure: RIGHT TOTAL KNEE ARTHROPLASTY;  Surgeon: Ollen Gross, MD;  Location: [facility];  Service: Orthopedics;  Laterality: Right;    Family History  Problem Relation Age of Onset   Cancer Other        breast   Stroke Other    Diabetes Other    Alzheimer's disease Other    Cancer Mother    Diabetes Father     Social History   Socioeconomic History   Marital status: Married    Spouse name: Not on file   Number of children: Not on file   Years of education: Not on file   Highest education level: Not on file    Occupational History   Not on file  Tobacco Use   Smoking status: Never Smoker   Smokeless tobacco: Never Used  Vaping Use   Vaping Use: Never used  Substance and Sexual Activity   Alcohol use: Yes    Comment: wine- 1 glass  daily or a , shot burbon daily occassionally   Drug use: No   Sexual activity: Not on file  Other Topics Concern   Not on file  Social History Narrative   Not on file   Social Determinants of Health   Financial Resource Strain: Low Risk    Difficulty of Paying Living Expenses: Not hard at all  Food Insecurity: No Food Insecurity   Worried About Programme researcher, broadcasting/film/video in the Last Year: Never true   Ran Out of Food in the Last Year: Never true  Transportation Needs: No Transportation Needs   Lack of Transportation (Medical): No   Lack of Transportation (Non-Medical): No  Physical Activity: Insufficiently Active   Days of Exercise per Week: 2 days   Minutes of Exercise per Session: 60 min  Stress: No Stress Concern Present   Feeling of Stress : Not at all  Social Connections: Moderately Integrated   Frequency of Communication with Friends and  Family: More than three times a week   Frequency of Social Gatherings with Friends and Family: Three times a week   Attends Religious Services: More than 4 times per year   Active Member of Clubs or Organizations: No   Attends Banker Meetings: Never   Marital Status: Married  Catering manager Violence: Not At Risk   Fear of Current or Ex-Partner: No   Emotionally Abused: No   Physically Abused: No   Sexually Abused: No    Outpatient Medications Prior to Visit  Medication Sig Dispense Refill   Cholecalciferol (VITAMIN D3) 10000 units TABS Take by mouth.     cyanocobalamin 500 MCG tablet Take 500 mcg daily by mouth.     metFORMIN (GLUCOPHAGE) 500 MG tablet Take one tablet once daily with breakfast 90 tablet 3   pravastatin (PRAVACHOL) 20 MG tablet Take 1 tablet (20 mg  total) by mouth daily. 90 tablet 3   zinc gluconate 50 MG tablet Take 50 mg by mouth daily.     aspirin EC 81 MG tablet Take 81 mg by mouth daily. (Patient not taking: Reported on 05/25/2020)     brimonidine-timolol (COMBIGAN) 0.2-0.5 % ophthalmic solution Place 1 drop into both eyes every 12 (twelve) hours. (Patient not taking: Reported on 05/25/2020)     triamcinolone cream (KENALOG) 0.5 % Apply 1 application topically 2 (two) times daily as needed. (Patient not taking: Reported on 05/25/2020) 30 g 1   No facility-administered medications prior to visit.    No Known Allergies  ROS Review of Systems  Constitutional: Negative for fatigue.  Eyes: Negative for visual disturbance.  Respiratory: Negative for cough, chest tightness, shortness of breath and wheezing.   Cardiovascular: Negative for chest pain, palpitations and leg swelling.  Endocrine: Negative for polydipsia and polyuria.  Neurological: Negative for dizziness, seizures, syncope, weakness, light-headedness and headaches.      Objective:    Physical Exam Vitals reviewed.  Constitutional:      Appearance: Normal appearance.  Cardiovascular:     Rate and Rhythm: Normal rate and regular rhythm.  Pulmonary:     Effort: Pulmonary effort is normal.     Breath sounds: Normal breath sounds.  Musculoskeletal:     Right lower leg: No edema.     Left lower leg: No edema.  Neurological:     General: No focal deficit present.     Mental Status: She is alert.     Cranial Nerves: No cranial nerve deficit.     BP (!) 180/80 (BP Location: Left Arm, Patient Position: Sitting, Cuff Size: Normal)    Pulse 68    Temp 98 F (36.7 C) (Oral)    Ht 5' (1.524 m)    Wt 130 lb 6.4 oz (59.1 kg)    SpO2 98%    BMI 25.47 kg/m  Wt Readings from Last 3 Encounters:  07/26/20 130 lb 6.4 oz (59.1 kg)  02/02/20 133 lb 9.6 oz (60.6 kg)  06/10/18 138 lb 6.4 oz (62.8 kg)     Health Maintenance Due  Topic Date Due   OPHTHALMOLOGY EXAM   12/27/2018    There are no preventive care reminders to display for this patient.  Lab Results  Component Value Date   TSH 2.10 02/02/2020   Lab Results  Component Value Date   WBC 4.7 02/02/2020   HGB 13.2 02/02/2020   HCT 38.6 02/02/2020   MCV 95.2 02/02/2020   PLT 265.0 02/02/2020   Lab Results  Component  Value Date   NA 139 02/02/2020   K 4.3 02/02/2020   CO2 27 02/02/2020   GLUCOSE 136 (H) 02/02/2020   BUN 19 02/02/2020   CREATININE 0.63 02/02/2020   BILITOT 1.0 02/02/2020   ALKPHOS 70 02/02/2020   AST 18 02/02/2020   ALT 13 02/02/2020   PROT 6.8 02/02/2020   ALBUMIN 4.6 02/02/2020   CALCIUM 9.7 02/02/2020   ANIONGAP 11 05/29/2018   GFR 90.98 02/02/2020   Lab Results  Component Value Date   CHOL 181 02/02/2020   Lab Results  Component Value Date   HDL 65.30 02/02/2020   Lab Results  Component Value Date   LDLCALC 103 (H) 02/02/2020   Lab Results  Component Value Date   TRIG 65.0 02/02/2020   Lab Results  Component Value Date   CHOLHDL 3 02/02/2020   Lab Results  Component Value Date   HGBA1C 6.2 (A) 07/26/2020      Assessment & Plan:   #1 type 2 diabetes improved with A1c 6.2%  -Continue low-dose Metformin -Continue low glycemic diet and regular exercise -Reassess A1c within 6 months  #2 hypertension.  She has not been treated with medications previously.  She had elevated reading per nurse left arm 180/80.  After several minutes of rest I checked her right arm and got a reading of 190/80 and confirmed 180/80 in the left arm  -Discussed nonpharmacologic factors.  She does drink some wine but usually less than 4 ounces per day.  Keep this 10 ounces or less per day -Continue regular aerobic exercise such as walking -Watch sodium intake -Given magnitude of elevation go ahead and start amlodipine 5 mg daily and bring back to reassess blood pressure in about 3 weeks  Meds ordered this encounter  Medications   amLODipine (NORVASC) 5 MG  tablet    Sig: Take 1 tablet (5 mg total) by mouth daily.    Dispense:  30 tablet    Refill:  3    Follow-up: Return in about 3 weeks (around 08/16/2020).    [name], MD

## 2020-07-26 NOTE — Patient Instructions (Signed)

## 2020-08-07 IMAGING — CR DG HIP (WITH OR WITHOUT PELVIS) 2-3V*R*
3 series · 3 of 3 positions shown · non-contrast
Comparison: None.

CLINICAL DATA: Initial evaluation for acute trauma, fall. Acute
right hip pain and pelvic pain.

EXAM:
DG HIP (WITH OR WITHOUT PELVIS) 2-3V RIGHT

[t pelvis ap]
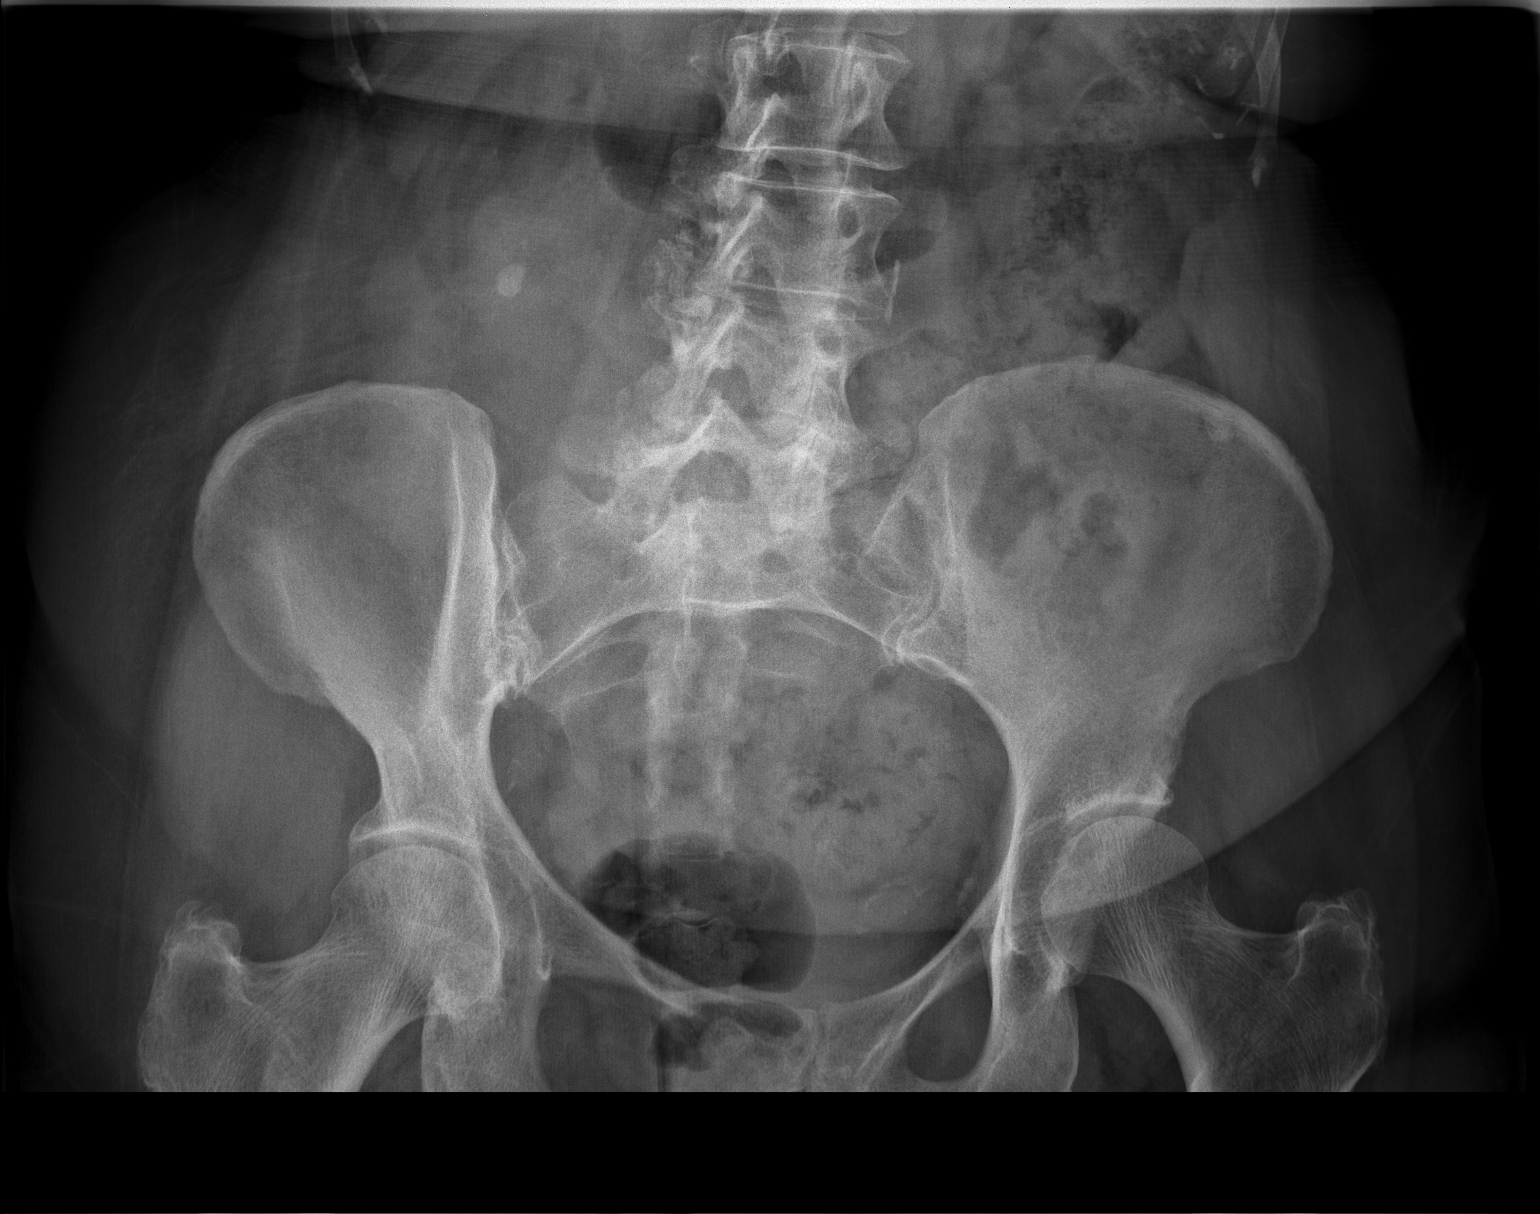

[t hip ap right]
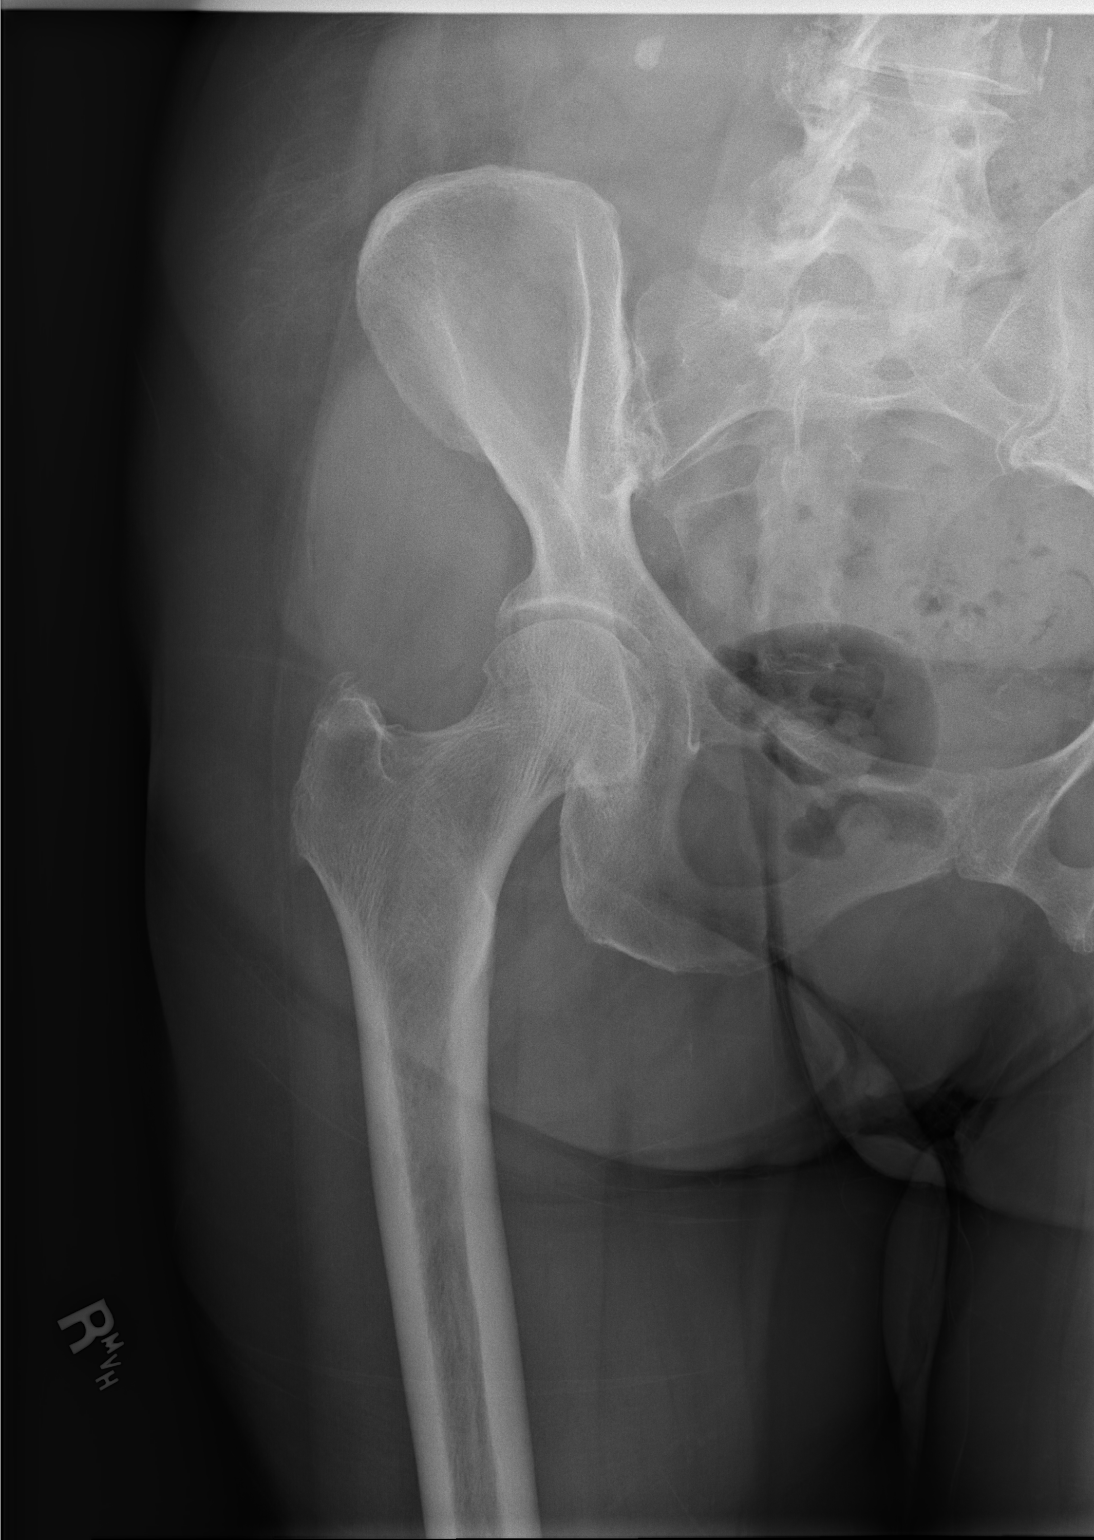

[t hip frog leg right]
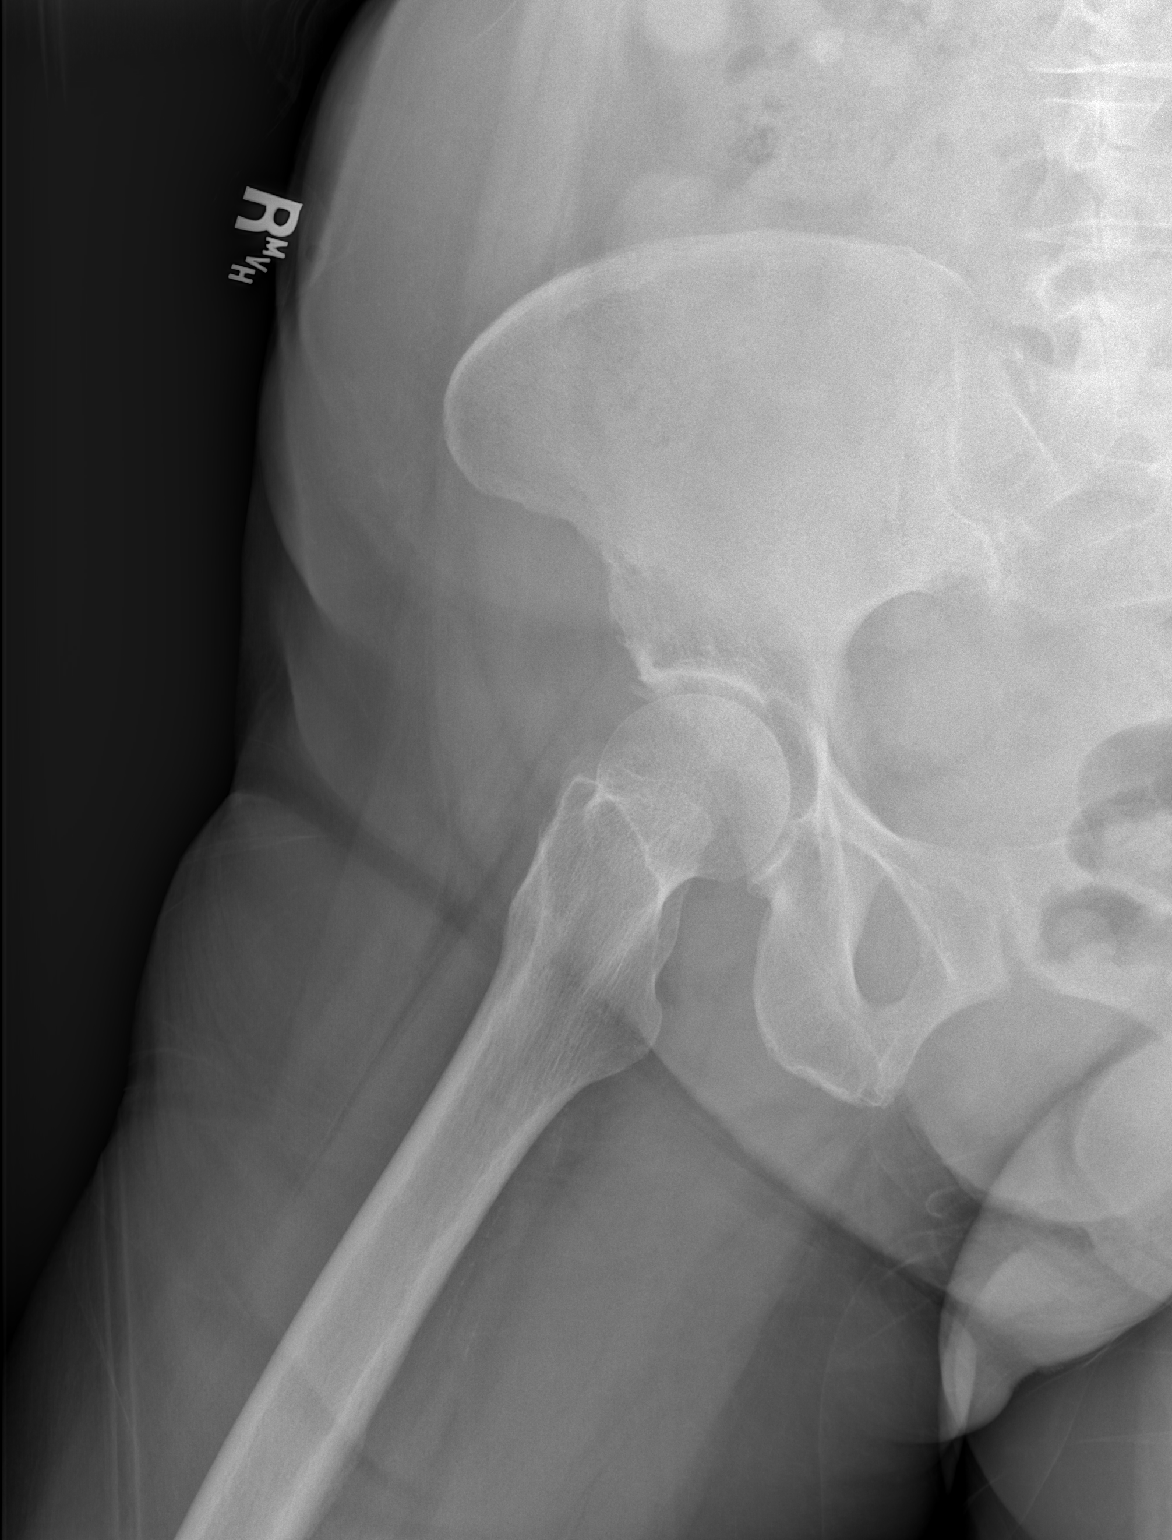

[3 of 3 positions shown; findings below may reference images not displayed]

FINDINGS: No acute fracture or dislocation. Femoral heads in normal alignment
within the acetabula. Femoral head heights maintained. Bony pelvis
intact. Moderate osteoarthritic changes about the right hip.
Prominent degenerative changes noted within the lower lumbar spine.

No acute soft tissue abnormality. 12 mm calcific density overlying
the right lower quadrant noted, of doubtful significance in the
acute setting.
IMPRESSION: 1. No acute osseous abnormality.
2. Moderate degenerative osteoarthrosis about the right hip.

## 2020-08-15 DIAGNOSIS — Z1231 Encounter for screening mammogram for malignant neoplasm of breast: Secondary | ICD-10-CM | POA: Diagnosis not present

## 2020-08-15 LAB — HM MAMMOGRAPHY

## 2020-08-17 DIAGNOSIS — E119 Type 2 diabetes mellitus without complications: Secondary | ICD-10-CM | POA: Diagnosis not present

## 2020-08-17 DIAGNOSIS — Z823 Family history of stroke: Secondary | ICD-10-CM | POA: Diagnosis not present

## 2020-08-17 DIAGNOSIS — E785 Hyperlipidemia, unspecified: Secondary | ICD-10-CM | POA: Diagnosis not present

## 2020-08-17 DIAGNOSIS — Z7984 Long term (current) use of oral hypoglycemic drugs: Secondary | ICD-10-CM | POA: Diagnosis not present

## 2020-08-17 DIAGNOSIS — I10 Essential (primary) hypertension: Secondary | ICD-10-CM | POA: Diagnosis not present

## 2020-08-17 DIAGNOSIS — Z803 Family history of malignant neoplasm of breast: Secondary | ICD-10-CM | POA: Diagnosis not present

## 2020-09-15 ENCOUNTER — Encounter: Payer: Self-pay | Admitting: Family Medicine

## 2020-10-18 ENCOUNTER — Other Ambulatory Visit: Payer: Self-pay | Admitting: Family Medicine

## 2020-10-27 ENCOUNTER — Other Ambulatory Visit: Payer: Self-pay

## 2020-10-28 ENCOUNTER — Encounter: Payer: Self-pay | Admitting: Family Medicine

## 2020-10-28 ENCOUNTER — Ambulatory Visit (INDEPENDENT_AMBULATORY_CARE_PROVIDER_SITE_OTHER): Payer: Medicare HMO | Admitting: Family Medicine

## 2020-10-28 VITALS — BP 142/70 | HR 76 | Ht 60.0 in | Wt 132.0 lb

## 2020-10-28 DIAGNOSIS — R21 Rash and other nonspecific skin eruption: Secondary | ICD-10-CM | POA: Diagnosis not present

## 2020-10-28 MED ORDER — TRIAMCINOLONE ACETONIDE 0.1 % EX CREA: 1.0000 "application " | TOPICAL_CREAM | Freq: Two times a day (BID) | CUTANEOUS | 1 refills | Status: DC | PRN

## 2020-10-28 NOTE — Patient Instructions (Signed)
Apply the medicated rash twice daily  May try OTC anti-histamine for itching such as Zyrtec or Claritan  Avoid scratching as much as possible.  Let me know if not some better in 2 weeks.

## 2020-10-28 NOTE — Progress Notes (Signed)
Established Patient Office Visit  Subjective:  Patient ID: Kelly Blake, female    DOB: 22-Jun-1941  Age: 80 y.o. MRN: [id number]  CC:  Chief Complaint  Patient presents with  . Rash    HPI  Kelly Blake presents for pruritic rash on her anterior chest predominantly for the past couple months.  No recent travels.  No clear precipitating factors.  Denies any changes soaps or detergents.  No new pets.  She has mild involvement of her lower back.  Sparing of the face and extremities.  She tried some type of over-the-counter antiitch cream which did not help much.  Itching seems to be worse at night.  Her husband has no rash.  Past Medical History:  Diagnosis Date  . Allergy   . Arthritis   . Chicken pox   . Chronic kidney disease    stones  . Colon polyps   . Diabetes mellitus without complication (HCC)   . Diverticulitis   . Hypertension     Past Surgical History:  Procedure Laterality Date  . BREAST SURGERY     right breast biopsy-benign  . DILATION AND CURETTAGE OF UTERUS  2008  . diverticulitis  2006   with abcess  . KNEE ARTHROSCOPY  2003   torn meniscus  . TOTAL KNEE ARTHROPLASTY Right 11/26/2016   Procedure: RIGHT TOTAL KNEE ARTHROPLASTY;  Surgeon: Ollen Gross, MD;  Location: [facility];  Service: Orthopedics;  Laterality: Right;    Family History  Problem Relation Age of Onset  . Cancer Other        breast  . Stroke Other   . Diabetes Other   . Alzheimer's disease Other   . Cancer Mother   . Diabetes Father     Social History   Socioeconomic History  . Marital status: Married    Spouse name: Not on file  . Number of children: Not on file  . Years of education: Not on file  . Highest education level: Not on file  Occupational History  . Not on file  Tobacco Use  . Smoking status: Never Smoker  . Smokeless tobacco: Never Used  Vaping Use  . Vaping Use: Never used  Substance and Sexual Activity  . Alcohol use: Yes    Comment: wine- 1 glass   daily or a , shot burbon daily occassionally  . Drug use: No  . Sexual activity: Not on file  Other Topics Concern  . Not on file  Social History Narrative  . Not on file   Social Determinants of Health   Financial Resource Strain: Low Risk   . Difficulty of Paying Living Expenses: Not hard at all  Food Insecurity: No Food Insecurity  . Worried About Programme researcher, broadcasting/film/video in the Last Year: Never true  . Ran Out of Food in the Last Year: Never true  Transportation Needs: No Transportation Needs  . Lack of Transportation (Medical): No  . Lack of Transportation (Non-Medical): No  Physical Activity: Insufficiently Active  . Days of Exercise per Week: 2 days  . Minutes of Exercise per Session: 60 min  Stress: No Stress Concern Present  . Feeling of Stress : Not at all  Social Connections: Moderately Integrated  . Frequency of Communication with Friends and Family: More than three times a week  . Frequency of Social Gatherings with Friends and Family: Three times a week  . Attends Religious Services: More than 4 times per year  . Active Member of  Clubs or Organizations: No  . Attends Banker Meetings: Never  . Marital Status: Married  Catering manager Violence: Not At Risk  . Fear of Current or Ex-Partner: No  . Emotionally Abused: No  . Physically Abused: No  . Sexually Abused: No    Outpatient Medications Prior to Visit  Medication Sig Dispense Refill  . amLODipine (NORVASC) 5 MG tablet TAKE 1 TABLET BY MOUTH EVERY DAY 90 tablet 1  . Cholecalciferol (VITAMIN D3) 10000 units TABS Take by mouth.    . cyanocobalamin 500 MCG tablet Take 500 mcg daily by mouth.    . metFORMIN (GLUCOPHAGE) 500 MG tablet Take one tablet once daily with breakfast 90 tablet 3  . pravastatin (PRAVACHOL) 20 MG tablet Take 1 tablet (20 mg total) by mouth daily. 90 tablet 3  . zinc gluconate 50 MG tablet Take 50 mg by mouth daily.     No facility-administered medications prior to visit.     No Known Allergies  ROS Review of Systems  Constitutional: Negative for chills and fever.  Skin: Positive for rash.      Objective:    Physical Exam Vitals reviewed.  Constitutional:      Appearance: Normal appearance.  Cardiovascular:     Rate and Rhythm: Normal rate and regular rhythm.  Pulmonary:     Effort: Pulmonary effort is normal.     Breath sounds: Normal breath sounds.  Skin:    Findings: Rash present.     Comments: Patient has slightly raised excoriated rash on her anterior chest.  No pustules.  No vesicles.  Nonscaly.  Similar areas of excoriation lower back region.  Neurological:     Mental Status: She is alert.     BP (!) 142/70 (BP Location: Left Arm, Cuff Size: Normal)   Pulse 76   Ht 5' (1.524 m)   Wt 132 lb (59.9 kg)   SpO2 98%   BMI 25.78 kg/m  Wt Readings from Last 3 Encounters:  10/28/20 132 lb (59.9 kg)  07/26/20 130 lb 6.4 oz (59.1 kg)  02/02/20 133 lb 9.6 oz (60.6 kg)     Health Maintenance Due  Topic Date Due  . OPHTHALMOLOGY EXAM  12/27/2018  . COVID-19 Vaccine (3 - Booster for Moderna series) 04/11/2020    There are no preventive care reminders to display for this patient.  Lab Results  Component Value Date   TSH 2.10 02/02/2020   Lab Results  Component Value Date   WBC 4.7 02/02/2020   HGB 13.2 02/02/2020   HCT 38.6 02/02/2020   MCV 95.2 02/02/2020   PLT 265.0 02/02/2020   Lab Results  Component Value Date   NA 139 02/02/2020   K 4.3 02/02/2020   CO2 27 02/02/2020   GLUCOSE 136 (H) 02/02/2020   BUN 19 02/02/2020   CREATININE 0.63 02/02/2020   BILITOT 1.0 02/02/2020   ALKPHOS 70 02/02/2020   AST 18 02/02/2020   ALT 13 02/02/2020   PROT 6.8 02/02/2020   ALBUMIN 4.6 02/02/2020   CALCIUM 9.7 02/02/2020   ANIONGAP 11 05/29/2018   GFR 90.98 02/02/2020   Lab Results  Component Value Date   CHOL 181 02/02/2020   Lab Results  Component Value Date   HDL 65.30 02/02/2020   Lab Results  Component Value Date    LDLCALC 103 (H) 02/02/2020   Lab Results  Component Value Date   TRIG 65.0 02/02/2020   Lab Results  Component Value Date   CHOLHDL 3  02/02/2020   Lab Results  Component Value Date   HGBA1C 6.2 (A) 07/26/2020      Assessment & Plan:   Problem List Items Addressed This Visit   None   Visit Diagnoses    Skin rash    -  Primary    Patient has nonspecific dermatitis rash on her anterior chest predominantly with minimal involvement of lower back.  Etiology unclear.  Not consistent with urticaria.  -Recommend trial of over-the-counter antihistamine such as Claritin or Zyrtec once daily -Triamcinolone 0.1% cream to use twice daily as needed -Avoid scratching as much as possible -Avoid prolonged showers or excessive heat -Touch base if not improving over the next couple weeks  Meds ordered this encounter  Medications  . triamcinolone (KENALOG) 0.1 %    Sig: Apply 1 application topically 2 (two) times daily as needed.    Dispense:  30 g    Refill:  1    Follow-up: No follow-ups on file.    [name], MD

## 2021-01-19 ENCOUNTER — Other Ambulatory Visit: Payer: Self-pay

## 2021-01-19 ENCOUNTER — Ambulatory Visit (INDEPENDENT_AMBULATORY_CARE_PROVIDER_SITE_OTHER): Payer: Medicare HMO | Admitting: Family Medicine

## 2021-01-19 ENCOUNTER — Encounter: Payer: Self-pay | Admitting: Family Medicine

## 2021-01-19 ENCOUNTER — Other Ambulatory Visit: Payer: Self-pay | Admitting: Family Medicine

## 2021-01-19 VITALS — BP 126/80 | HR 67 | Temp 98.0°F | Wt 130.6 lb

## 2021-01-19 DIAGNOSIS — L309 Dermatitis, unspecified: Secondary | ICD-10-CM

## 2021-01-19 MED ORDER — METHYLPREDNISOLONE 4 MG PO TBPK: ORAL_TABLET | ORAL | 0 refills | Status: DC

## 2021-01-19 MED ORDER — BETAMETHASONE DIPROPIONATE 0.05 % EX CREA: TOPICAL_CREAM | Freq: Two times a day (BID) | CUTANEOUS | 0 refills | Status: DC

## 2021-01-19 NOTE — Progress Notes (Signed)
   Subjective:    Patient ID: Kelly Blake, female    DOB: April 11, 1941, 80 y.o.   MRN: [id number]  HPI Here for an itchy rash that started about 3 months ago. It started on her chest, but over time has spread to her abdomen, back, and both legs. No recent medication changes, no new soaps or detergents, etc. She had never had this before. She saw Dr. Caryl Never in February, and he gave her Triamcinolone cream. This has not helped at all, and she has become frustrated. She has not taken any antihistamines.    Review of Systems  Constitutional: Negative.   Respiratory: Negative.   Cardiovascular: Negative.   Skin: Positive for rash.       Objective:   Physical Exam Constitutional:      General: She is not in acute distress.    Appearance: Normal appearance.  Cardiovascular:     Rate and Rhythm: Normal rate and regular rhythm.     Pulses: Normal pulses.     Heart sounds: Normal heart sounds.  Pulmonary:     Effort: Pulmonary effort is normal.     Breath sounds: Normal breath sounds.  Skin:    Comments: There are scattered areas of small red papular lesions which are excoriated over the trunk and legs   Neurological:     Mental Status: She is alert.           Assessment & Plan:  Eczema. We will stop the Triamcinolone cream and switch to Betamethasone cream BID. She will add Zyrtec 10 mg BID as needed. Finally we will treat her with a Medrol dose pack. Recheck as needed.  Gershon Crane, MD

## 2021-01-28 ENCOUNTER — Other Ambulatory Visit: Payer: Self-pay | Admitting: Family Medicine

## 2021-04-08 ENCOUNTER — Other Ambulatory Visit: Payer: Self-pay | Admitting: Family Medicine

## 2021-05-26 ENCOUNTER — Ambulatory Visit (INDEPENDENT_AMBULATORY_CARE_PROVIDER_SITE_OTHER): Payer: Medicare HMO

## 2021-05-26 ENCOUNTER — Ambulatory Visit: Payer: Medicare HMO

## 2021-05-26 ENCOUNTER — Other Ambulatory Visit: Payer: Self-pay

## 2021-05-26 VITALS — BP 132/72 | HR 70 | Temp 98.4°F | Ht 60.0 in | Wt 131.0 lb

## 2021-05-26 DIAGNOSIS — Z23 Encounter for immunization: Secondary | ICD-10-CM | POA: Diagnosis not present

## 2021-05-26 DIAGNOSIS — Z0000 Encounter for general adult medical examination without abnormal findings: Secondary | ICD-10-CM

## 2021-05-26 NOTE — Patient Instructions (Signed)
Ms. Kelly Blake , Thank you for taking time to come for your Medicare Wellness Visit. I appreciate your ongoing commitment to your health goals. Please review the following plan we discussed and let me know if I can assist you in the future.   Screening recommendations/referrals: Colonoscopy: no longer required  Mammogram: no longer required  Bone Density: 12/22/2014 Recommended yearly ophthalmology/optometry visit for glaucoma screening and checkup Recommended yearly dental visit for hygiene and checkup  Vaccinations: Influenza vaccine: completed  Pneumococcal vaccine: completed series  Tdap vaccine: due with injury  Shingles vaccine: will consider     Advanced directives: in chart   Conditions/risks identified: none   Next appointment: none    Preventive Care 65 Years and Older, Female Preventive care refers to lifestyle choices and visits with your health care provider that can promote health and wellness. What does preventive care include? A yearly physical exam. This is also called an annual well check. Dental exams once or twice a year. Routine eye exams. Ask your health care provider how often you should have your eyes checked. Personal lifestyle choices, including: Daily care of your teeth and gums. Regular physical activity. Eating a healthy diet. Avoiding tobacco and drug use. Limiting alcohol use. Practicing safe sex. Taking low-dose aspirin every day. Taking vitamin and mineral supplements as recommended by your health care provider. What happens during an annual well check? The services and screenings done by your health care provider during your annual well check will depend on your age, overall health, lifestyle risk factors, and family history of disease. Counseling  Your health care provider may ask you questions about your: Alcohol use. Tobacco use. Drug use. Emotional well-being. Home and relationship well-being. Sexual activity. Eating habits. History  of falls. Memory and ability to understand (cognition). Work and work Astronomer. Reproductive health. Screening  You may have the following tests or measurements: Height, weight, and BMI. Blood pressure. Lipid and cholesterol levels. These may be checked every 5 years, or more frequently if you are over 60 years old. Skin check. Lung cancer screening. You may have this screening every year starting at age 64 if you have a 30-pack-year history of smoking and currently smoke or have quit within the past 15 years. Fecal occult blood test (FOBT) of the stool. You may have this test every year starting at age 54. Flexible sigmoidoscopy or colonoscopy. You may have a sigmoidoscopy every 5 years or a colonoscopy every 10 years starting at age 15. Hepatitis C blood test. Hepatitis B blood test. Sexually transmitted disease (STD) testing. Diabetes screening. This is done by checking your blood sugar (glucose) after you have not eaten for a while (fasting). You may have this done every 1-3 years. Bone density scan. This is done to screen for osteoporosis. You may have this done starting at age 71. Mammogram. This may be done every 1-2 years. Talk to your health care provider about how often you should have regular mammograms. Talk with your health care provider about your test results, treatment options, and if necessary, the need for more tests. Vaccines  Your health care provider may recommend certain vaccines, such as: Influenza vaccine. This is recommended every year. Tetanus, diphtheria, and acellular pertussis (Tdap, Td) vaccine. You may need a Td booster every 10 years. Zoster vaccine. You may need this after age 71. Pneumococcal 13-valent conjugate (PCV13) vaccine. One dose is recommended after age 54. Pneumococcal polysaccharide (PPSV23) vaccine. One dose is recommended after age 58. Talk to your health care  provider about which screenings and vaccines you need and how often you need  them. This information is not intended to replace advice given to you by your health care provider. Make sure you discuss any questions you have with your health care provider. Document Released: 09/16/2015 Document Revised: 05/09/2016 Document Reviewed: 06/21/2015 Elsevier Interactive Patient Education  2017 [employer] Prevention in the Home Falls can cause injuries. They can happen to people of all ages. There are many things you can do to make your home safe and to help prevent falls. What can I do on the outside of my home? Regularly fix the edges of walkways and driveways and fix any cracks. Remove anything that might make you trip as you walk through a door, such as a raised step or threshold. Trim any bushes or trees on the path to your home. Use bright outdoor lighting. Clear any walking paths of anything that might make someone trip, such as rocks or tools. Regularly check to see if handrails are loose or broken. Make sure that both sides of any steps have handrails. Any raised decks and porches should have guardrails on the edges. Have any leaves, snow, or ice cleared regularly. Use sand or salt on walking paths during winter. Clean up any spills in your garage right away. This includes oil or grease spills. What can I do in the bathroom? Use night lights. Install grab bars by the toilet and in the tub and shower. Do not use towel bars as grab bars. Use non-skid mats or decals in the tub or shower. If you need to sit down in the shower, use a plastic, non-slip stool. Keep the floor dry. Clean up any water that spills on the floor as soon as it happens. Remove soap buildup in the tub or shower regularly. Attach bath mats securely with double-sided non-slip rug tape. Do not have throw rugs and other things on the floor that can make you trip. What can I do in the bedroom? Use night lights. Make sure that you have a light by your bed that is easy to reach. Do not use  any sheets or blankets that are too big for your bed. They should not hang down onto the floor. Have a firm chair that has side arms. You can use this for support while you get dressed. Do not have throw rugs and other things on the floor that can make you trip. What can I do in the kitchen? Clean up any spills right away. Avoid walking on wet floors. Keep items that you use a lot in easy-to-reach places. If you need to reach something above you, use a strong step stool that has a grab bar. Keep electrical cords out of the way. Do not use floor polish or wax that makes floors slippery. If you must use wax, use non-skid floor wax. Do not have throw rugs and other things on the floor that can make you trip. What can I do with my stairs? Do not leave any items on the stairs. Make sure that there are handrails on both sides of the stairs and use them. Fix handrails that are broken or loose. Make sure that handrails are as long as the stairways. Check any carpeting to make sure that it is firmly attached to the stairs. Fix any carpet that is loose or worn. Avoid having throw rugs at the top or bottom of the stairs. If you do have throw rugs, attach them to the  floor with carpet tape. Make sure that you have a light switch at the top of the stairs and the bottom of the stairs. If you do not have them, ask someone to add them for you. What else can I do to help prevent falls? Wear shoes that: Do not have high heels. Have rubber bottoms. Are comfortable and fit you well. Are closed at the toe. Do not wear sandals. If you use a stepladder: Make sure that it is fully opened. Do not climb a closed stepladder. Make sure that both sides of the stepladder are locked into place. Ask someone to hold it for you, if possible. Clearly mark and make sure that you can see: Any grab bars or handrails. First and last steps. Where the edge of each step is. Use tools that help you move around (mobility aids)  if they are needed. These include: Canes. Walkers. Scooters. Crutches. Turn on the lights when you go into a dark area. Replace any light bulbs as soon as they burn out. Set up your furniture so you have a clear path. Avoid moving your furniture around. If any of your floors are uneven, fix them. If there are any pets around you, be aware of where they are. Review your medicines with your doctor. Some medicines can make you feel dizzy. This can increase your chance of falling. Ask your doctor what other things that you can do to help prevent falls. This information is not intended to replace advice given to you by your health care provider. Make sure you discuss any questions you have with your health care provider. Document Released: 06/16/2009 Document Revised: 01/26/2016 Document Reviewed: 09/24/2014 Elsevier Interactive Patient Education  2017 [employer].

## 2021-05-26 NOTE — Addendum Note (Signed)
Addended bySolon Augusta on: 05/26/2021 02:28 PM   Modules accepted: Orders

## 2021-05-26 NOTE — Progress Notes (Signed)
Subjective:   Kelly Blake is a 80 y.o. female who presents for Medicare Annual (Subsequent) preventive examination.  Review of Systems    N/A       Objective:    There were no vitals filed for this visit. There is no height or weight on file to calculate BMI.  Advanced Directives 05/25/2020 11/26/2016 11/26/2016 11/26/2016 11/21/2016  Does Patient Have a Medical Advance Directive? Yes - Yes Yes Yes  Type of Estate agent of [place];Living will Healthcare Power of [place];Living will Healthcare Power of [place];Living will Healthcare Power of [place];Living will Healthcare Power of [place];Living will  Does patient want to make changes to medical advance directive? No - Patient declined No - Patient declined - - -  Copy of Healthcare Power of Attorney in Chart? Yes - validated most recent copy scanned in chart (See row information) Yes Yes Yes No - copy requested    Current Medications (verified) Outpatient Encounter Medications as of 05/26/2021  Medication Sig   amLODipine (NORVASC) 5 MG tablet TAKE 1 TABLET BY MOUTH EVERY DAY   betamethasone dipropionate 0.05 % cream Apply topically 2 (two) times daily.   Cholecalciferol (VITAMIN D3) 10000 units TABS Take by mouth.   cyanocobalamin 500 MCG tablet Take 500 mcg daily by mouth.   metFORMIN (GLUCOPHAGE) 500 MG tablet TAKE ONE TABLET ONCE DAILY WITH BREAKFAST   methylPREDNISolone (MEDROL DOSEPAK) 4 MG TBPK tablet As directed   pravastatin (PRAVACHOL) 20 MG tablet Take 1 tablet (20 mg total) by mouth daily.   zinc gluconate 50 MG tablet Take 50 mg by mouth daily.   No facility-administered encounter medications on file as of 05/26/2021.    Allergies (verified) Patient has no known allergies.   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   Procedure: RIGHT TOTAL KNEE ARTHROPLASTY;  Surgeon: Ollen Gross, MD;  Location: [facility];  Service: Orthopedics;  Laterality: Right;   Family History  Problem Relation Age of Onset   Cancer Other        breast   Stroke Other    Diabetes Other    Alzheimer's disease Other    Cancer Mother    Diabetes Father    Social History   Socioeconomic History   Marital status: Married    Spouse name: Not on file   Number of children: Not on file   Years of education: Not on file   Highest education level: Not on file  Occupational History   Not on file  Tobacco Use   Smoking status: Never   Smokeless tobacco: Never  Vaping Use   Vaping Use: Never used  Substance and Sexual Activity   Alcohol use: Yes    Comment: wine- 1 glass  daily or a , shot burbon daily occassionally   Drug use: No   Sexual activity: Not on file  Other Topics Concern   Not on file  Social  History Narrative   Not on file   Social Determinants of Health   Financial Resource Strain: Not on file  Food Insecurity: Not on file  Transportation Needs: Not on file  Physical Activity: Not on file  Stress: Not on file  Social Connections: Not on file    Tobacco Counseling Counseling given: Not Answered   Clinical Intake:                 Diabetic?yes Nutrition Risk Assessment:  Has the patient had any N/V/D within the last 2 months?  No  Does the patient have any non-healing wounds?  No  Has the patient had any unintentional weight loss or weight gain?  No   Diabetes:  Is the patient diabetic?  Yes  If diabetic, was a CBG obtained today?  No  Did the patient bring in their glucometer from home?  No  How often do  you monitor your CBG's? does not   Financial Strains and Diabetes Management:  Are you having any financial strains with the device, your supplies or your medication? No .  Does the patient want to be seen by Chronic Care Management for management of their diabetes?  No  Would the patient like to be referred to a Nutritionist or for Diabetic Management?  No   Diabetic Exams:  Diabetic Eye Exam: Completed 09/2020 Diabetic Foot Exam: Overdue, Pt has been advised about the importance in completing this exam. Pt is scheduled for diabetic foot exam on next office visit .          Activities of Daily Living No flowsheet data found.  Patient Care Team: Kristian Covey, MD as PCP - General (Family Medicine)  Indicate any recent Medical Services you may have received from other than [facility] providers in the past year (date may be approximate).     Assessment:   This is a routine wellness examination for Kelly Blake.  Hearing/Vision screen No results found.  Dietary issues and exercise activities discussed:     Goals Addressed   None    Depression Screen PHQ 2/9 Scores 05/25/2020 02/02/2020 06/10/2018 04/02/2017 06/29/2016 01/14/2016 01/13/2016  PHQ - 2 Score 0 0 0 0 0 0 0  PHQ- 9 Score 0 - - - - - -    Fall Risk Fall Risk  05/25/2020 02/02/2020 06/10/2018 04/02/2017 06/29/2016  Falls in the past year? 0 0 Yes No No  Number falls in past yr: 0 0 1 - -  Injury with Fall? 0 0 Yes - -  Risk for fall due to : No Fall Risks - - - -  Follow up Falls evaluation completed;Falls prevention discussed Falls evaluation completed - - -    FALL RISK PREVENTION PERTAINING TO THE HOME:  Any stairs in or around the home? Yes  If so, are there any without handrails? No  Home free of loose throw rugs in walkways, pet beds, electrical cords, etc? Yes  Adequate lighting in your home to reduce risk of falls? Yes   ASSISTIVE DEVICES UTILIZED TO PREVENT FALLS:  Life alert? No  Use of a cane, walker or w/c?  No  Grab bars in the bathroom? Yes  Shower chair or bench in shower? No  Elevated toilet seat or a handicapped toilet? No   TIMED UP AND GO:  Was the test performed? Yes .  Length of time to ambulate 10 feet: 10 sec.   Gait steady and fast without use of assistive device  Cognitive Function:  Normal cognitive status assessed by direct observation by this Nurse Health Advisor. No abnormalities found.        Immunizations Immunization History  Administered Date(s) Administered   Fluad Quad(high Dose 65+) 05/06/2019, 06/15/2020   Influenza Split 06/21/2011, 05/21/2012   Influenza, High Dose Seasonal PF 05/27/2015, 06/11/2016, 06/28/2017, 06/10/2018   Influenza,inj,Quad PF,6+ Mos 06/03/2013, 06/04/2014   Moderna Sars-Covid-2 Vaccination 09/15/2019, 10/13/2019   Pneumococcal Conjugate-13 12/22/2014   Pneumococcal Polysaccharide-23 04/02/2017   Tdap 06/21/2011    TDAP status: Due, Education has been provided regarding the importance of this vaccine. Advised may receive this vaccine at local pharmacy or Health Dept. Aware to provide a copy of the vaccination record if obtained from local pharmacy or Health Dept. Verbalized acceptance and understanding.  Flu Vaccine status: Up to date  Pneumococcal vaccine status: Up to date  Covid-19 vaccine status: Completed vaccines  Qualifies for Shingles Vaccine? Yes   Zostavax completed No   Shingrix Completed?: No.    Education has been provided regarding the importance of this vaccine. Patient has been advised to call insurance company to determine out of pocket expense if they have not yet received this vaccine. Advised may also receive vaccine at local pharmacy or Health Dept. Verbalized acceptance and understanding.  Screening Tests Health Maintenance  Topic Date Due   Zoster Vaccines- Shingrix (1 of 2) Never done   OPHTHALMOLOGY EXAM  12/27/2018   COVID-19 Vaccine (3 - Booster for Moderna series) 03/11/2020   HEMOGLOBIN A1C   01/23/2021   FOOT EXAM  02/01/2021   URINE MICROALBUMIN  02/01/2021   INFLUENZA VACCINE  04/03/2021   TETANUS/TDAP  06/20/2021   DEXA SCAN  Completed   HPV VACCINES  Aged Out    Health Maintenance  Health Maintenance Due  Topic Date Due   Zoster Vaccines- Shingrix (1 of 2) Never done   OPHTHALMOLOGY EXAM  12/27/2018   COVID-19 Vaccine (3 - Booster for Moderna series) 03/11/2020   HEMOGLOBIN A1C  01/23/2021   FOOT EXAM  02/01/2021   URINE MICROALBUMIN  02/01/2021   INFLUENZA VACCINE  04/03/2021    Colorectal cancer screening: No longer required.   Mammogram status: No longer required due to age.  Bone Density status: Completed 12/22/2014. Results reflect: Bone density results: OSTEOPENIA. Repeat every 5 years.  Lung Cancer Screening: (Low Dose CT Chest recommended if Age 35-80 years, 30 pack-year currently smoking OR have quit w/in 15years.) does not qualify.   Lung Cancer Screening Referral: n/a  Additional Screening:  Hepatitis C Screening: does not qualify;   Vision Screening: Recommended annual ophthalmology exams for early detection of glaucoma and other disorders of the eye. Is the patient up to date with their annual eye exam?  Yes  Who is the provider or what is the name of the office in which the patient attends annual eye exams? [facility]  If pt is not established with a provider, would they like to be referred to a provider to establish care? No .   Dental Screening: Recommended annual dental exams for proper oral hygiene  Community Resource Referral / Chronic Care Management: CRR required this visit?  No   CCM required this visit?  No      Plan:     I have personally reviewed and noted the following in the patient's chart:   Medical and social history Use of alcohol, tobacco or illicit drugs  Current medications and supplements including opioid prescriptions.  Functional ability and status Nutritional status Physical activity  Advanced  directives List of other physicians Hospitalizations, surgeries, and ER visits in previous 12 months Vitals Screenings to include cognitive, depression, and falls Referrals and appointments  In addition, I have reviewed and discussed with patient certain preventive protocols, quality metrics, and best practice recommendations. A written personalized care plan for preventive services as well as general preventive health recommendations were provided to patient.     March Rummage, LPN   [id number]   Nurse Notes: none

## 2021-06-09 ENCOUNTER — Telehealth: Payer: Self-pay | Admitting: Family Medicine

## 2021-06-09 NOTE — Telephone Encounter (Signed)
Patient called to ask if there was anything she can take to help with her back pain. When she turns it catches and at night it hurts badly. She is currently taking acetaminophen and it is easing it but she would like something else. Appointment is scheduled for 10/10 with Dr.Burchette      Good callback number is [phone]    Please advise

## 2021-06-09 NOTE — Telephone Encounter (Signed)
ATC, unable to leave a voice mail. Line was busy. 

## 2021-06-12 ENCOUNTER — Other Ambulatory Visit: Payer: Self-pay

## 2021-06-12 ENCOUNTER — Ambulatory Visit (INDEPENDENT_AMBULATORY_CARE_PROVIDER_SITE_OTHER): Payer: Medicare HMO | Admitting: Family Medicine

## 2021-06-12 VITALS — BP 150/70 | HR 80 | Temp 98.1°F | Wt 131.9 lb

## 2021-06-12 DIAGNOSIS — M545 Low back pain, unspecified: Secondary | ICD-10-CM

## 2021-06-12 NOTE — Progress Notes (Signed)
Established Patient Office Visit  Subjective:  Patient ID: Kelly Blake, female    DOB: [birth year]  Age: 80 y.o. MRN: [id number]  CC:  Chief Complaint  Patient presents with   Back Pain    X 1 week, no known injury, mid back, usually first thing in he morning, not very much pain during the day    HPI Kelly Blake presents for lumbar back pain which started about a week ago.  This is upper lumbar region and somewhat poorly localized and bilateral.  She denies any injury.  [facility] some class exercises but does not recall any specific injury.  Her pain is a dull ache currently 1 out of 10.  Pain tends to be worse at night.  She has difficulty getting a comfortable position.  No difficulty with ambulation.  No radiculitis symptoms.  No dysuria.  No fevers or chills.  No appetite or weight changes.  She has taken Tylenol with mild relief.  No history of cancer.  Past Medical History:  Diagnosis Date   Allergy    Arthritis    Chicken pox    Chronic kidney disease    stones   Colon polyps    Diabetes mellitus without complication (HCC)    Diverticulitis    Hypertension     Past Surgical History:  Procedure Laterality Date   BREAST SURGERY     right breast biopsy-benign   DILATION AND CURETTAGE OF UTERUS  2008   diverticulitis  2006   with abcess   KNEE ARTHROSCOPY  2003   torn meniscus   TOTAL KNEE ARTHROPLASTY Right 11/26/2016   Procedure: RIGHT TOTAL KNEE ARTHROPLASTY;  Surgeon: Ollen Gross, MD;  Location: [facility];  Service: Orthopedics;  Laterality: Right;    Family History  Problem Relation Age of Onset   Cancer Other        breast   Stroke Other    Diabetes Other    Alzheimer's disease Other    Cancer Mother    Diabetes Father     Social History   Socioeconomic History   Marital status: Married    Spouse name: Not on file   Number of children: Not on file   Years of education: Not on file   Highest  education level: Not on file  Occupational History   Not on file  Tobacco Use   Smoking status: Never   Smokeless tobacco: Never  Vaping Use   Vaping Use: Never used  Substance and Sexual Activity   Alcohol use: Yes    Comment: wine- 1 glass  daily or a , shot burbon daily occassionally   Drug use: No   Sexual activity: Not on file  Other Topics Concern   Not on file  Social History Narrative   Not on file   Social Determinants of Health   Financial Resource Strain: Low Risk    Difficulty of Paying Living Expenses: Not hard at all  Food Insecurity: No Food Insecurity   Worried About Programme researcher, broadcasting/film/video in the Last Year: Never true   Ran Out of Food in the Last Year: Never true  Transportation Needs: No Transportation Needs   Lack of Transportation (Medical): No   Lack of Transportation (Non-Medical): No  Physical Activity: Insufficiently Active   Days of Exercise per Week: 2 days   Minutes of Exercise per Session: 50 min  Stress: No Stress Concern Present  Feeling of Stress : Not at all  Social Connections: Moderately Integrated   Frequency of Communication with Friends and Family: Three times a week   Frequency of Social Gatherings with Friends and Family: Three times a week   Attends Religious Services: 1 to 4 times per year   Active Member of Clubs or Organizations: No   Attends Banker Meetings: Never   Marital Status: Married  Catering manager Violence: Not At Risk   Fear of Current or Ex-Partner: No   Emotionally Abused: No   Physically Abused: No   Sexually Abused: No    Outpatient Medications Prior to Visit  Medication Sig Dispense Refill   amLODipine (NORVASC) 5 MG tablet TAKE 1 TABLET BY MOUTH EVERY DAY 90 tablet 1   betamethasone dipropionate 0.05 % cream Apply topically 2 (two) times daily. 45 g 0   Cholecalciferol (VITAMIN D3) 10000 units TABS Take by mouth.     cyanocobalamin 500 MCG tablet Take 500 mcg daily by mouth.     metFORMIN  (GLUCOPHAGE) 500 MG tablet TAKE ONE TABLET ONCE DAILY WITH BREAKFAST 90 tablet 3   methylPREDNISolone (MEDROL DOSEPAK) 4 MG TBPK tablet As directed 21 tablet 0   pravastatin (PRAVACHOL) 20 MG tablet Take 1 tablet (20 mg total) by mouth daily. 90 tablet 3   zinc gluconate 50 MG tablet Take 50 mg by mouth daily.     No facility-administered medications prior to visit.    No Known Allergies  ROS Review of Systems  Constitutional:  Negative for appetite change, chills, fever and unexpected weight change.  Respiratory:  Negative for cough.   Cardiovascular:  Negative for chest pain.  Gastrointestinal:  Negative for abdominal pain.  Musculoskeletal:  Positive for back pain.  Neurological:  Negative for weakness.  Hematological:  Negative for adenopathy.     Objective:    Physical Exam Vitals reviewed.  Constitutional:      Appearance: Normal appearance.  Cardiovascular:     Rate and Rhythm: Normal rate and regular rhythm.  Pulmonary:     Effort: Pulmonary effort is normal.     Breath sounds: Normal breath sounds.  Musculoskeletal:     Comments: No spinal tenderness in the lumbar region or lower thoracic region.  Straight leg raises are negative bilaterally.  Neurological:     Mental Status: She is alert.     Comments: Full strength lower extremities.  Deep tendon reflexes 2+ ankle and knee bilaterally.  She has normal sensory function throughout    BP (!) 150/70 (BP Location: Left Arm, Patient Position: Sitting, Cuff Size: Normal)   Pulse 80   Temp 98.1 F (36.7 C) (Oral)   Wt 131 lb 14.4 oz (59.8 kg)   SpO2 97%   BMI 25.76 kg/m  Wt Readings from Last 3 Encounters:  06/12/21 131 lb 14.4 oz (59.8 kg)  05/26/21 131 lb (59.4 kg)  01/19/21 130 lb 9.6 oz (59.2 kg)     Health Maintenance Due  Topic Date Due   Zoster Vaccines- Shingrix (1 of 2) Never done   OPHTHALMOLOGY EXAM  12/27/2018   COVID-19 Vaccine (3 - Booster for Moderna series) 03/11/2020   HEMOGLOBIN A1C   01/23/2021   FOOT EXAM  02/01/2021   URINE MICROALBUMIN  02/01/2021    There are no preventive care reminders to display for this patient.  Lab Results  Component Value Date   TSH 2.10 02/02/2020   Lab Results  Component Value Date   WBC 4.7  02/02/2020   HGB 13.2 02/02/2020   HCT 38.6 02/02/2020   MCV 95.2 02/02/2020   PLT 265.0 02/02/2020   Lab Results  Component Value Date   NA 139 02/02/2020   K 4.3 02/02/2020   CO2 27 02/02/2020   GLUCOSE 136 (H) 02/02/2020   BUN 19 02/02/2020   CREATININE 0.63 02/02/2020   BILITOT 1.0 02/02/2020   ALKPHOS 70 02/02/2020   AST 18 02/02/2020   ALT 13 02/02/2020   PROT 6.8 02/02/2020   ALBUMIN 4.6 02/02/2020   CALCIUM 9.7 02/02/2020   ANIONGAP 11 05/29/2018   GFR 90.98 02/02/2020   Lab Results  Component Value Date   CHOL 181 02/02/2020   Lab Results  Component Value Date   HDL 65.30 02/02/2020   Lab Results  Component Value Date   LDLCALC 103 (H) 02/02/2020   Lab Results  Component Value Date   TRIG 65.0 02/02/2020   Lab Results  Component Value Date   CHOLHDL 3 02/02/2020   Lab Results  Component Value Date   HGBA1C 6.2 (A) 07/26/2020      Assessment & Plan:   Upper lumbar back pain of 1 week duration.  Nonfocal exam.  No history of injury.  Does not any red flags such as appetite change, fever, weight loss, history of cancer, etc. Low clinical suspicion for compression fracture  -Recommend she continue with Tylenol and may supplement short-term with Advil or Aleve. -Reviewed some simple back stretches to work on -2-week follow-up.  If not improved at that time consider further evaluation  No orders of the defined types were placed in this encounter.   Follow-up: Return in about 2 weeks (around 06/26/2021).    [name], MD

## 2021-06-12 NOTE — Telephone Encounter (Signed)
Patient has an appointment today. This will be discussed further at her appointment.

## 2021-06-23 ENCOUNTER — Ambulatory Visit (INDEPENDENT_AMBULATORY_CARE_PROVIDER_SITE_OTHER): Payer: Medicare HMO | Admitting: Family Medicine

## 2021-06-23 ENCOUNTER — Other Ambulatory Visit: Payer: Self-pay

## 2021-06-23 VITALS — BP 130/64 | HR 80 | Temp 98.0°F | Wt 131.3 lb

## 2021-06-23 DIAGNOSIS — Z0000 Encounter for general adult medical examination without abnormal findings: Secondary | ICD-10-CM | POA: Diagnosis not present

## 2021-06-23 NOTE — Progress Notes (Signed)
Established Patient Office Visit  Subjective:  Patient ID: Kelly Blake, female    DOB: 16-Apr-1941  Age: 80 y.o. MRN: [id number]  CC:  Chief Complaint  Patient presents with   Follow-up    HPI [name] presents for physical exam.  She had some recent upper back pain which is improving.  She continues to exercise regularly.  She has chronic problems including type 2 diabetes, hyperlipidemia, glaucoma.  Maintenance reviewed  -Flu vaccine already given -She had Zostavax but no history of Shingrix -Last tetanus 2012 -Gets yearly mammograms -Aged out of colonoscopy -Pneumonia vaccines complete  Social history and family history reviewed with no significant changes.  She has never smoked.  Past Medical History:  Diagnosis Date   Allergy    Arthritis    Chicken pox    Chronic kidney disease    stones   Colon polyps    Diabetes mellitus without complication (HCC)    Diverticulitis    Hypertension     Past Surgical History:  Procedure Laterality Date   BREAST SURGERY     right breast biopsy-benign   DILATION AND CURETTAGE OF UTERUS  2008   diverticulitis  2006   with abcess   KNEE ARTHROSCOPY  2003   torn meniscus   TOTAL KNEE ARTHROPLASTY Right 11/26/2016   Procedure: RIGHT TOTAL KNEE ARTHROPLASTY;  Surgeon: Ollen Gross, MD;  Location: [facility];  Service: Orthopedics;  Laterality: Right;    Family History  Problem Relation Age of Onset   Cancer Other        breast   Stroke Other    Diabetes Other    Alzheimer's disease Other    Cancer Mother    Diabetes Father     Social History   Socioeconomic History   Marital status: Married    Spouse name: Not on file   Number of children: Not on file   Years of education: Not on file   Highest education level: Not on file  Occupational History   Not on file  Tobacco Use   Smoking status: Never   Smokeless tobacco: Never  Vaping Use   Vaping Use: Never used  Substance and Sexual Activity   Alcohol  use: Yes    Comment: wine- 1 glass  daily or a , shot burbon daily occassionally   Drug use: No   Sexual activity: Not on file  Other Topics Concern   Not on file  Social History Narrative   Not on file   Social Determinants of Health   Financial Resource Strain: Low Risk    Difficulty of Paying Living Expenses: Not hard at all  Food Insecurity: No Food Insecurity   Worried About Programme researcher, broadcasting/film/video in the Last Year: Never true   Ran Out of Food in the Last Year: Never true  Transportation Needs: No Transportation Needs   Lack of Transportation (Medical): No   Lack of Transportation (Non-Medical): No  Physical Activity: Insufficiently Active   Days of Exercise per Week: 2 days   Minutes of Exercise per Session: 50 min  Stress: No Stress Concern Present   Feeling of Stress : Not at all  Social Connections: Moderately Integrated   Frequency of Communication with Friends and Family: Three times a week   Frequency of Social Gatherings with Friends and Family: Three times a week   Attends Religious Services: 1 to 4 times per year   Active Member of Clubs or Organizations: No  Attends Banker Meetings: Never   Marital Status: Married  Catering manager Violence: Not At Risk   Fear of Current or Ex-Partner: No   Emotionally Abused: No   Physically Abused: No   Sexually Abused: No    Outpatient Medications Prior to Visit  Medication Sig Dispense Refill   amLODipine (NORVASC) 5 MG tablet TAKE 1 TABLET BY MOUTH EVERY DAY 90 tablet 1   betamethasone dipropionate 0.05 % cream Apply topically 2 (two) times daily. 45 g 0   Cholecalciferol (VITAMIN D3) 10000 units TABS Take by mouth.     cyanocobalamin 500 MCG tablet Take 500 mcg daily by mouth.     metFORMIN (GLUCOPHAGE) 500 MG tablet TAKE ONE TABLET ONCE DAILY WITH BREAKFAST 90 tablet 3   methylPREDNISolone (MEDROL DOSEPAK) 4 MG TBPK tablet As directed 21 tablet 0   pravastatin (PRAVACHOL) 20 MG tablet Take 1 tablet (20  mg total) by mouth daily. 90 tablet 3   zinc gluconate 50 MG tablet Take 50 mg by mouth daily.     No facility-administered medications prior to visit.    No Known Allergies  ROS Review of Systems  Constitutional:  Negative for activity change, appetite change, fatigue, fever and unexpected weight change.  HENT:  Negative for ear pain, hearing loss, sore throat and trouble swallowing.   Eyes:  Negative for visual disturbance.  Respiratory:  Negative for cough and shortness of breath.   Cardiovascular:  Negative for chest pain and palpitations.  Gastrointestinal:  Negative for abdominal pain, blood in stool, constipation and diarrhea.  Genitourinary:  Negative for dysuria and hematuria.  Musculoskeletal:  Negative for arthralgias and myalgias.  Skin:  Negative for rash.  Neurological:  Negative for dizziness, syncope and headaches.  Hematological:  Negative for adenopathy.  Psychiatric/Behavioral:  Negative for confusion and dysphoric mood.      Objective:    Physical Exam Constitutional:      Appearance: She is well-developed.  HENT:     Head: Normocephalic and atraumatic.  Eyes:     Pupils: Pupils are equal, round, and reactive to light.  Neck:     Thyroid: No thyromegaly.  Cardiovascular:     Rate and Rhythm: Normal rate and regular rhythm.     Heart sounds: Normal heart sounds. No murmur heard. Pulmonary:     Effort: No respiratory distress.     Breath sounds: Normal breath sounds. No wheezing or rales.  Abdominal:     General: Bowel sounds are normal. There is no distension.     Palpations: Abdomen is soft. There is no mass.     Tenderness: There is no abdominal tenderness. There is no guarding or rebound.  Musculoskeletal:        General: Normal range of motion.     Cervical back: Normal range of motion and neck supple.     Right lower leg: No edema.     Left lower leg: No edema.  Lymphadenopathy:     Cervical: No cervical adenopathy.  Skin:    Findings: No  rash.     Comments: Feet reveal no skin lesions. Good distal foot pulses. Good capillary refill. No calluses. Normal sensation with monofilament testing   Neurological:     Mental Status: She is alert and oriented to person, place, and time.     Cranial Nerves: No cranial nerve deficit.     Deep Tendon Reflexes: Reflexes normal.  Psychiatric:        Behavior: Behavior normal.  Thought Content: Thought content normal.        Judgment: Judgment normal.    BP 130/64 (BP Location: Left Arm, Patient Position: Sitting, Cuff Size: Normal)   Pulse 80   Temp 98 F (36.7 C) (Oral)   Wt 131 lb 4.8 oz (59.6 kg)   SpO2 98%   BMI 25.64 kg/m  Wt Readings from Last 3 Encounters:  06/23/21 131 lb 4.8 oz (59.6 kg)  06/12/21 131 lb 14.4 oz (59.8 kg)  05/26/21 131 lb (59.4 kg)     Health Maintenance Due  Topic Date Due   COVID-19 Vaccine (3 - Booster for Moderna series) 12/08/2019   HEMOGLOBIN A1C  01/23/2021   FOOT EXAM  02/01/2021   URINE MICROALBUMIN  02/01/2021   TETANUS/TDAP  06/20/2021    There are no preventive care reminders to display for this patient.  Lab Results  Component Value Date   TSH 2.10 02/02/2020   Lab Results  Component Value Date   WBC 4.7 02/02/2020   HGB 13.2 02/02/2020   HCT 38.6 02/02/2020   MCV 95.2 02/02/2020   PLT 265.0 02/02/2020   Lab Results  Component Value Date   NA 139 02/02/2020   K 4.3 02/02/2020   CO2 27 02/02/2020   GLUCOSE 136 (H) 02/02/2020   BUN 19 02/02/2020   CREATININE 0.63 02/02/2020   BILITOT 1.0 02/02/2020   ALKPHOS 70 02/02/2020   AST 18 02/02/2020   ALT 13 02/02/2020   PROT 6.8 02/02/2020   ALBUMIN 4.6 02/02/2020   CALCIUM 9.7 02/02/2020   ANIONGAP 11 05/29/2018   GFR 90.98 02/02/2020   Lab Results  Component Value Date   CHOL 181 02/02/2020   Lab Results  Component Value Date   HDL 65.30 02/02/2020   Lab Results  Component Value Date   LDLCALC 103 (H) 02/02/2020   Lab Results  Component Value Date    TRIG 65.0 02/02/2020   Lab Results  Component Value Date   CHOLHDL 3 02/02/2020   Lab Results  Component Value Date   HGBA1C 6.2 (A) 07/26/2020      Assessment & Plan:   Problem List Items Addressed This Visit   None Visit Diagnoses     Physical exam    -  Primary   Relevant Orders   Basic metabolic panel   Lipid panel   CBC with Differential/Platelet   TSH   Hepatic function panel   Hemoglobin A1c   Microalbumin/Creatinine Ratio, Urine     Patient here for wellness exam.  Flu vaccine already given.  She will check on coverage for Shingrix vaccine and we have encouraged her to consider getting this.  Obtain screening labs as above.  Continue yearly eye exam.  Continue regular weightbearing exercise and daily calcium and vitamin D  No orders of the defined types were placed in this encounter.   Follow-up: No follow-ups on file.    [name], MD

## 2021-07-22 ENCOUNTER — Other Ambulatory Visit: Payer: Self-pay | Admitting: Family Medicine

## 2021-08-21 DIAGNOSIS — Z1231 Encounter for screening mammogram for malignant neoplasm of breast: Secondary | ICD-10-CM | POA: Diagnosis not present

## 2021-10-03 ENCOUNTER — Other Ambulatory Visit: Payer: Self-pay | Admitting: Family Medicine

## 2022-01-20 ENCOUNTER — Other Ambulatory Visit: Payer: Self-pay | Admitting: Family Medicine

## 2022-02-09 ENCOUNTER — Other Ambulatory Visit: Payer: Self-pay | Admitting: Family Medicine

## 2022-04-18 ENCOUNTER — Ambulatory Visit (INDEPENDENT_AMBULATORY_CARE_PROVIDER_SITE_OTHER): Payer: Medicare HMO | Admitting: Family Medicine

## 2022-04-18 ENCOUNTER — Telehealth: Payer: Self-pay | Admitting: Family Medicine

## 2022-04-18 ENCOUNTER — Encounter: Payer: Self-pay | Admitting: Family Medicine

## 2022-04-18 VITALS — BP 138/60 | HR 78 | Temp 98.1°F | Ht 60.0 in | Wt 129.4 lb

## 2022-04-18 DIAGNOSIS — I10 Essential (primary) hypertension: Secondary | ICD-10-CM | POA: Diagnosis not present

## 2022-04-18 DIAGNOSIS — E1136 Type 2 diabetes mellitus with diabetic cataract: Secondary | ICD-10-CM | POA: Diagnosis not present

## 2022-04-18 DIAGNOSIS — R42 Dizziness and giddiness: Secondary | ICD-10-CM

## 2022-04-18 LAB — POCT GLYCOSYLATED HEMOGLOBIN (HGB A1C): Hemoglobin A1C: 6.6 % — AB (ref 4.0–5.6)

## 2022-04-18 MED ORDER — AMLODIPINE BESYLATE 5 MG PO TABS: 5.0000 mg | ORAL_TABLET | Freq: Every day | ORAL | 3 refills | Status: DC

## 2022-04-18 NOTE — Progress Notes (Signed)
Established Patient Office Visit  Subjective   Patient ID: Kelly Blake, female    DOB: 10-Nov-1940  Age: 81 y.o. MRN: [id number]  Chief Complaint  Patient presents with   Medication Consultation    HPI   Kelly Blake has history of hypertension, type 2 diabetes, osteoarthritis, hyperlipidemia.  She had called earlier today for refills of amlodipine.  She requested follow-up to discuss some things.  Blood pressure currently managed with amlodipine 5 mg daily.  Well-controlled by home readings.  Most readings 130s systolic and around 70 diastolic.  No recent headaches.  Does need refills of amlodipine  Type 2 diabetes.  History of good control.  Remains on low-dose metformin 500 mg once daily.  No significant polyuria or polydipsia recently.  She has had some recent mild intermittent dizziness past couple days.  Is had somewhat of similar dizziness in the past.  Sensation of mild disequilibrium.  No orthostatic symptoms.  No syncopal or presyncopal symptoms.  No palpitations.  No clear triggers other than movement in general.  Has not noted clear directional component.  Denies any focal neurologic symptoms such as diplopia, blurred vision, focal weakness, ataxia, speech changes, dysphagia, or any confusion  Past Medical History:  Diagnosis Date   Allergy    Arthritis    Chicken pox    Chronic kidney disease    stones   Colon polyps    Diabetes mellitus without complication (HCC)    Diverticulitis    Hypertension    Past Surgical History:  Procedure Laterality Date   BREAST SURGERY     right breast biopsy-benign   DILATION AND CURETTAGE OF UTERUS  2008   diverticulitis  2006   with abcess   KNEE ARTHROSCOPY  2003   torn meniscus   TOTAL KNEE ARTHROPLASTY Right 11/26/2016   Procedure: RIGHT TOTAL KNEE ARTHROPLASTY;  Surgeon: Ollen Gross, MD;  Location: [facility];  Service: Orthopedics;  Laterality: Right;    reports that she has never smoked. She has never used smokeless  tobacco. She reports current alcohol use. She reports that she does not use drugs. family history includes Alzheimer's disease in an other family member; Cancer in her mother and another family member; Diabetes in her father and another family member; Stroke in an other family member. No Known Allergies  Review of Systems  Constitutional:  Negative for malaise/fatigue.  Eyes:  Negative for blurred vision.  Respiratory:  Negative for shortness of breath.   Cardiovascular:  Negative for chest pain.  Gastrointestinal:  Negative for abdominal pain.  Genitourinary:  Negative for dysuria.  Neurological:  Positive for dizziness. Negative for tremors, speech change, focal weakness, seizures, loss of consciousness, weakness and headaches.      Objective:     BP 138/60 (BP Location: Left Arm, Cuff Size: Normal)   Pulse 78   Temp 98.1 F (36.7 C) (Oral)   Ht 5' (1.524 m)   Wt 129 lb 6.4 oz (58.7 kg)   SpO2 97%   BMI 25.27 kg/m    Physical Exam Vitals reviewed.  Constitutional:      Appearance: She is well-developed.  HENT:     Head: Normocephalic and atraumatic.  Eyes:     Pupils: Pupils are equal, round, and reactive to light.  Neck:     Thyroid: No thyromegaly.     Vascular: No JVD.  Cardiovascular:     Rate and Rhythm: Normal rate and regular rhythm.     Heart sounds:  No gallop.  Pulmonary:     Effort: Pulmonary effort is normal. No respiratory distress.     Breath sounds: Normal breath sounds. No wheezing or rales.  Musculoskeletal:     Cervical back: Neck supple.  Neurological:     General: No focal deficit present.     Mental Status: She is alert and oriented to person, place, and time. Mental status is at baseline.     Cranial Nerves: No cranial nerve deficit.     Motor: No weakness.     Coordination: Coordination normal.     Gait: Gait normal.  Psychiatric:        Mood and Affect: Mood normal.      Results for orders placed or performed in visit on  04/18/22  POC HgB A1c  Result Value Ref Range   Hemoglobin A1C 6.6 (A) 4.0 - 5.6 %   HbA1c POC (<> result, manual entry)     HbA1c, POC (prediabetic range)     HbA1c, POC (controlled diabetic range)        The ASCVD Risk score (Arnett DK, et al., 2019) failed to calculate for the following reasons:   The 2019 ASCVD risk score is only valid for ages 86 to 26    Assessment & Plan:   #1 hypertension stable and adequately controlled on amlodipine 5 mg daily.  Refill for 1 year  #2 type 2 diabetes controlled with A1c today 6.6%.  Continue low-dose metformin.  Continue low glycemic diet.  Monitor A1c every 6 months  #3 hyperlipidemia treated with pravastatin.  Set up 34-month follow-up and will get fasting labs then.  #4 intermittent dizziness.  This sounds more vestibular in nature.  Nonfocal neuro exam.  Symptoms resolved at this time.  Reviewed signs and symptoms for more worrisome vertigo   Return in about 3 months (around 07/19/2022).    Kelly Peat, MD

## 2022-04-18 NOTE — Telephone Encounter (Signed)
FYI.  Refill not sent, due to appointment being scheduled on today at 4pm

## 2022-04-18 NOTE — Patient Instructions (Signed)
Set up 3 month follow up.    We will get follow up labs then.

## 2022-04-18 NOTE — Telephone Encounter (Signed)
Pt called to request a refill of the: amLODipine (NORVASC) 5 MG tablet  Last OV:  06/23/2021  Pt was offered an OV and decided she wanted to come in this afternoon at 4pm.  Please advise.  [id number] [place], [place] - [address] Phone:  [phone]  Fax:  [phone]

## 2022-05-17 ENCOUNTER — Telehealth: Payer: Self-pay | Admitting: Family Medicine

## 2022-05-17 NOTE — Telephone Encounter (Signed)
Left message for patient to call back and schedule Medicare Annual Wellness Visit (AWV) either virtually or in office. Left  my jabber number [phone]   Last AWV 05/26/21 ; please schedule at anytime with [facility] Nurse Health Advisor 1 or 2    

## 2022-05-22 ENCOUNTER — Other Ambulatory Visit: Payer: Self-pay | Admitting: Family Medicine

## 2022-05-22 ENCOUNTER — Other Ambulatory Visit: Payer: Self-pay

## 2022-06-22 ENCOUNTER — Telehealth: Payer: Self-pay | Admitting: Family Medicine

## 2022-06-22 NOTE — Telephone Encounter (Signed)
Left message for patient to call back and schedule Medicare Annual Wellness Visit (AWV) either virtually or in office. Left  my jabber number [phone]   Last AWV 05/26/21 please schedule with Nurse Health Adviser   45 min for awv-i and in office appointments 30 min for awv-s  phone/virtual appointments  

## 2022-06-22 NOTE — Telephone Encounter (Signed)
Pt called to return RH's call. RH was unavailable. Pt was given jabber number and she said she would try RH later.

## 2022-06-25 ENCOUNTER — Telehealth: Payer: Self-pay | Admitting: Family Medicine

## 2022-06-25 NOTE — Telephone Encounter (Signed)
error 

## 2022-06-25 NOTE — Telephone Encounter (Signed)
Returned patients call.

## 2022-07-13 ENCOUNTER — Telehealth: Payer: Self-pay | Admitting: Family Medicine

## 2022-07-13 NOTE — Telephone Encounter (Signed)
Left message for patient to call back and schedule Medicare Annual Wellness Visit (AWV) either virtually or in office. Left  my jabber number [phone]   Last AWV 05/26/21 please schedule with Nurse Health Adviser   45 min for awv-i and in office appointments 30 min for awv-s  phone/virtual appointments  

## 2022-08-08 ENCOUNTER — Ambulatory Visit (INDEPENDENT_AMBULATORY_CARE_PROVIDER_SITE_OTHER): Payer: Medicare HMO | Admitting: Family Medicine

## 2022-08-08 ENCOUNTER — Encounter: Payer: Self-pay | Admitting: Family Medicine

## 2022-08-08 VITALS — BP 150/60 | HR 78 | Temp 98.1°F | Ht 60.0 in | Wt 131.6 lb

## 2022-08-08 DIAGNOSIS — I10 Essential (primary) hypertension: Secondary | ICD-10-CM | POA: Diagnosis not present

## 2022-08-08 DIAGNOSIS — E1136 Type 2 diabetes mellitus with diabetic cataract: Secondary | ICD-10-CM | POA: Diagnosis not present

## 2022-08-08 DIAGNOSIS — E785 Hyperlipidemia, unspecified: Secondary | ICD-10-CM | POA: Diagnosis not present

## 2022-08-08 DIAGNOSIS — M545 Low back pain, unspecified: Secondary | ICD-10-CM | POA: Diagnosis not present

## 2022-08-08 MED ORDER — PRAVASTATIN SODIUM 20 MG PO TABS: 20.0000 mg | ORAL_TABLET | Freq: Every day | ORAL | 3 refills | Status: DC

## 2022-08-08 NOTE — Patient Instructions (Signed)
Monitor blood pressure and be in touch if consistently > 140 on the top number.

## 2022-08-08 NOTE — Progress Notes (Signed)
Established Patient Office Visit  Subjective   Patient ID: Kelly Blake, female    DOB: September 21, 1940  Age: 81 y.o. MRN: [id number]  Chief Complaint  Patient presents with   Back Pain    Patient complains of back pain, x2 days,     HPI   Kelly Blake is seen with onset of low back pain Monday morning.  She states she got up to go the bathroom around 6 AM and did not recall any pain.  She went back to bed and then when she got up around 7:15 AM she had fairly severe back pain upper to mid lumbar area.  Pain is gradually improved since then.  She states that she and her husband have a old mattress and wonders if that may be exacerbating things.  She actually was able to go to exercise earlier today without difficulty.  Has not localize any specific point tenderness.  She taken some Tylenol with some relief.  No dysuria.  No radiculitis symptoms.  No lower extremity numbness or weakness.  She has hypertension, type 2 diabetes, hyperlipidemia.  Needs refills of pravastatin.  She is overdue for labs.  Last A1c was 6.6%.  Does not monitor regularly.  She takes amlodipine 5 mg daily for hypertension.  Is on metformin for diabetes.  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 She has never used smokeless tobacco. She reports current alcohol use. She reports that she does not use  drugs. family history includes Alzheimer's disease in an other family member; Cancer in her mother and another family member; Diabetes in her father and another family member; Stroke in an other family member. No Known Allergies  Review of Systems  Constitutional:  Negative for chills, fever and malaise/fatigue.  Eyes:  Negative for blurred vision.  Respiratory:  Negative for shortness of breath.   Cardiovascular:  Negative for chest pain.  Gastrointestinal:  Negative for abdominal pain.  Genitourinary:  Negative for dysuria and hematuria.  Musculoskeletal:  Positive for back pain.  Neurological:  Negative for dizziness, weakness and headaches.      Objective:     BP (!) 150/60 (BP Location: Left Arm, Patient Position: Sitting, Cuff Size: Normal)   Pulse 78   Temp 98.1 F (36.7 C) (Oral)   Ht 5' (1.524 m)   Wt 131 lb 9.6 oz (59.7 kg)   SpO2 97%   BMI 25.70 kg/m    Physical Exam Constitutional:      Appearance: She is well-developed.  Eyes:     Pupils: Pupils are equal, round, and reactive to light.  Neck:     Thyroid: No thyromegaly.     Vascular: No JVD.  Cardiovascular:     Rate and Rhythm: Normal rate and  regular rhythm.     Heart sounds:     No gallop.  Pulmonary:     Effort: Pulmonary effort is normal. No respiratory distress.     Breath sounds: Normal breath sounds. No wheezing or rales.  Musculoskeletal:     Cervical back: Neck supple.     Right lower leg: No edema.     Left lower leg: No edema.     Comments: No spinal tenderness in the thoracic or lumbar region.  Straight leg raises are negative bilaterally.  Neurological:     Mental Status: She is alert.     Comments: Full strength lower extremities throughout      No results found for any visits on 08/08/22.    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  Suspect musculoskeletal.  Nonfocal exam.  Does not have any localizing tenderness to suggest likely acute compression fracture.  Pain somewhat improved today  -Consider new mattress -Follow-up for now and be in touch if she has any recurrent pain or other concerns -Tylenol as needed -Avoid non-steroidals  -Refilled her pravastatin for 1 year  -Her blood pressure was up today and we have recommended close monitoring at home over the next few weeks and be in touch if systolic consistently over 140.  Return in about 2 months (around 10/09/2022).    [name], MD

## 2022-08-09 LAB — BASIC METABOLIC PANEL
BUN: 20 mg/dL (ref 6–23)
CO2: 28 mEq/L (ref 19–32)
Calcium: 9.8 mg/dL (ref 8.4–10.5)
Chloride: 104 mEq/L (ref 96–112)
Creatinine, Ser: 0.79 mg/dL (ref 0.40–1.20)
GFR: 69.72 mL/min (ref >60.00)
Glucose, Bld: 99 mg/dL (ref 70–99)
Potassium: 4.3 mEq/L (ref 3.5–5.1)
Sodium: 139 mEq/L (ref 135–145)

## 2022-08-09 LAB — LIPID PANEL
Cholesterol: 187 mg/dL (ref 0–200)
HDL: 74.6 mg/dL (ref >39.00)
LDL Cholesterol: 98 mg/dL (ref 0–99)
NonHDL: 111.94
Total CHOL/HDL Ratio: 3
Triglycerides: 72 mg/dL (ref 0.0–149.0)
VLDL: 14.4 mg/dL (ref 0.0–40.0)

## 2022-08-09 LAB — HEPATIC FUNCTION PANEL
ALT: 15 U/L (ref 0–35)
AST: 21 U/L (ref 0–37)
Albumin: 4.7 g/dL (ref 3.5–5.2)
Alkaline Phosphatase: 72 U/L (ref 39–117)
Bilirubin, Direct: 0.1 mg/dL (ref 0.0–0.3)
Total Bilirubin: 0.9 mg/dL (ref 0.2–1.2)
Total Protein: 7.3 g/dL (ref 6.0–8.3)

## 2022-08-09 LAB — HEMOGLOBIN A1C: Hgb A1c MFr Bld: 7.1 % — ABNORMAL HIGH (ref 4.6–6.5)

## 2022-08-09 LAB — MICROALBUMIN / CREATININE URINE RATIO
Creatinine,U: 117.4 mg/dL
Microalb Creat Ratio: 1.9 mg/g (ref 0.0–30.0)
Microalb, Ur: 2.2 mg/dL — ABNORMAL HIGH (ref 0.0–1.9)

## 2022-08-21 ENCOUNTER — Telehealth: Payer: Medicare HMO | Admitting: Family Medicine

## 2022-08-21 ENCOUNTER — Other Ambulatory Visit: Payer: Self-pay | Admitting: Family Medicine

## 2022-09-05 ENCOUNTER — Telehealth: Payer: Self-pay | Admitting: Family Medicine

## 2022-09-05 MED ORDER — METFORMIN HCL 500 MG PO TABS: ORAL_TABLET | ORAL | 0 refills | Status: DC

## 2022-09-05 NOTE — Telephone Encounter (Signed)
Rx sent 

## 2022-09-05 NOTE — Telephone Encounter (Signed)
Pt called to request a refill of the following:  metFORMIN (GLUCOPHAGE) 500 MG tablet   LOV:  08/08/22  [id number] [address], [place] - [place] Phone: [phone]  Fax: [phone]

## 2022-10-24 ENCOUNTER — Ambulatory Visit (INDEPENDENT_AMBULATORY_CARE_PROVIDER_SITE_OTHER): Payer: Medicare HMO

## 2022-10-24 VITALS — BP 124/64 | HR 74 | Temp 98.1°F | Ht 60.0 in | Wt 133.0 lb

## 2022-10-24 DIAGNOSIS — Z0000 Encounter for general adult medical examination without abnormal findings: Secondary | ICD-10-CM | POA: Diagnosis not present

## 2022-10-24 NOTE — Patient Instructions (Addendum)
Kelly Blake , Thank you for taking time to come for your Medicare Wellness Visit. I appreciate your ongoing commitment to your health goals. Please review the following plan we discussed and let me know if I can assist you in the future.   These are the goals we discussed:  Goals       Patient Stated (pt-stated)      I will continue to go to my exercise class Monday,and Wednesday for 1 hour.        This is a list of the screening recommended for you and due dates:  Health Maintenance  Topic Date Due   DTaP/Tdap/Td vaccine (2 - Td or Tdap) 06/20/2021   Eye exam for diabetics  10/24/2022*   Complete foot exam   10/25/2022*   COVID-19 Vaccine (3 - 2023-24 season) 11/09/2022*   Flu Shot  12/02/2022*   Zoster (Shingles) Vaccine (1 of 2) 01/22/2023*   Hemoglobin A1C  02/07/2023   Yearly kidney function blood test for diabetes  08/09/2023   Yearly kidney health urinalysis for diabetes  08/09/2023   Medicare Annual Wellness Visit  10/25/2023   Pneumonia Vaccine  Completed   DEXA scan (bone density measurement)  Completed   HPV Vaccine  Aged Out  *Topic was postponed. The date shown is not the original due date.    Advanced directives: In Chart  Conditions/risks identified: None  Next appointment: Follow up in one year for your annual wellness visit     Preventive Care 65 Years and Older, Female Preventive care refers to lifestyle choices and visits with your health care provider that can promote health and wellness. What does preventive care include? A yearly physical exam. This is also called an annual well check. Dental exams once or twice a year. Routine eye exams. Ask your health care provider how often you should have your eyes checked. Personal lifestyle choices, including: Daily care of your teeth and gums. Regular physical activity. Eating a healthy diet. Avoiding tobacco and drug use. Limiting alcohol use. Practicing safe sex. Taking low-dose aspirin every  day. Taking vitamin and mineral supplements as recommended by your health care provider. What happens during an annual well check? The services and screenings done by your health care provider during your annual well check will depend on your age, overall health, lifestyle risk factors, and family history of disease. Counseling  Your health care provider may ask you questions about your: Alcohol use. Tobacco use. Drug use. Emotional well-being. Home and relationship well-being. Sexual activity. Eating habits. History of falls. Memory and ability to understand (cognition). Work and work Statistician. Reproductive health. Screening  You may have the following tests or measurements: Height, weight, and BMI. Blood pressure. Lipid and cholesterol levels. These may be checked every 5 years, or more frequently if you are over 86 years old. Skin check. Lung cancer screening. You may have this screening every year starting at age 70 if you have a 30-pack-year history of smoking and currently smoke or have quit within the past 15 years. Fecal occult blood test (FOBT) of the stool. You may have this test every year starting at age 67. Flexible sigmoidoscopy or colonoscopy. You may have a sigmoidoscopy every 5 years or a colonoscopy every 10 years starting at age 67. Hepatitis C blood test. Hepatitis B blood test. Sexually transmitted disease (STD) testing. Diabetes screening. This is done by checking your blood sugar (glucose) after you have not eaten for a while (fasting). You may have this done  every 1-3 years. Bone density scan. This is done to screen for osteoporosis. You may have this done starting at age 41. Mammogram. This may be done every 1-2 years. Talk to your health care provider about how often you should have regular mammograms. Talk with your health care provider about your test results, treatment options, and if necessary, the need for more tests. Vaccines  Your health care  provider may recommend certain vaccines, such as: Influenza vaccine. This is recommended every year. Tetanus, diphtheria, and acellular pertussis (Tdap, Td) vaccine. You may need a Td booster every 10 years. Zoster vaccine. You may need this after age 7. Pneumococcal 13-valent conjugate (PCV13) vaccine. One dose is recommended after age 16. Pneumococcal polysaccharide (PPSV23) vaccine. One dose is recommended after age 75. Talk to your health care provider about which screenings and vaccines you need and how often you need them. This information is not intended to replace advice given to you by your health care provider. Make sure you discuss any questions you have with your health care provider. Document Released: 09/16/2015 Document Revised: 05/09/2016 Document Reviewed: 06/21/2015 Elsevier Interactive Patient Education  2017 [place] Prevention in the Home Falls can cause injuries. They can happen to people of all ages. There are many things you can do to make your home safe and to help prevent falls. What can I do on the outside of my home? Regularly fix the edges of walkways and driveways and fix any cracks. Remove anything that might make you trip as you walk through a door, such as a raised step or threshold. Trim any bushes or trees on the path to your home. Use bright outdoor lighting. Clear any walking paths of anything that might make someone trip, such as rocks or tools. Regularly check to see if handrails are loose or broken. Make sure that both sides of any steps have handrails. Any raised decks and porches should have guardrails on the edges. Have any leaves, snow, or ice cleared regularly. Use sand or salt on walking paths during winter. Clean up any spills in your garage right away. This includes oil or grease spills. What can I do in the bathroom? Use night lights. Install grab bars by the toilet and in the tub and shower. Do not use towel bars as grab  bars. Use non-skid mats or decals in the tub or shower. If you need to sit down in the shower, use a plastic, non-slip stool. Keep the floor dry. Clean up any water that spills on the floor as soon as it happens. Remove soap buildup in the tub or shower regularly. Attach bath mats securely with double-sided non-slip rug tape. Do not have throw rugs and other things on the floor that can make you trip. What can I do in the bedroom? Use night lights. Make sure that you have a light by your bed that is easy to reach. Do not use any sheets or blankets that are too big for your bed. They should not hang down onto the floor. Have a firm chair that has side arms. You can use this for support while you get dressed. Do not have throw rugs and other things on the floor that can make you trip. What can I do in the kitchen? Clean up any spills right away. Avoid walking on wet floors. Keep items that you use a lot in easy-to-reach places. If you need to reach something above you, use a strong step stool that has  a grab bar. Keep electrical cords out of the way. Do not use floor polish or wax that makes floors slippery. If you must use wax, use non-skid floor wax. Do not have throw rugs and other things on the floor that can make you trip. What can I do with my stairs? Do not leave any items on the stairs. Make sure that there are handrails on both sides of the stairs and use them. Fix handrails that are broken or loose. Make sure that handrails are as long as the stairways. Check any carpeting to make sure that it is firmly attached to the stairs. Fix any carpet that is loose or worn. Avoid having throw rugs at the top or bottom of the stairs. If you do have throw rugs, attach them to the floor with carpet tape. Make sure that you have a light switch at the top of the stairs and the bottom of the stairs. If you do not have them, ask someone to add them for you. What else can I do to help prevent  falls? Wear shoes that: Do not have high heels. Have rubber bottoms. Are comfortable and fit you well. Are closed at the toe. Do not wear sandals. If you use a stepladder: Make sure that it is fully opened. Do not climb a closed stepladder. Make sure that both sides of the stepladder are locked into place. Ask someone to hold it for you, if possible. Clearly mark and make sure that you can see: Any grab bars or handrails. First and last steps. Where the edge of each step is. Use tools that help you move around (mobility aids) if they are needed. These include: Canes. Walkers. Scooters. Crutches. Turn on the lights when you go into a dark area. Replace any light bulbs as soon as they burn out. Set up your furniture so you have a clear path. Avoid moving your furniture around. If any of your floors are uneven, fix them. If there are any pets around you, be aware of where they are. Review your medicines with your doctor. Some medicines can make you feel dizzy. This can increase your chance of falling. Ask your doctor what other things that you can do to help prevent falls. This information is not intended to replace advice given to you by your health care provider. Make sure you discuss any questions you have with your health care provider. Document Released: 06/16/2009 Document Revised: 01/26/2016 Document Reviewed: 09/24/2014 Elsevier Interactive Patient Education  2017 [employer].

## 2022-10-24 NOTE — Progress Notes (Signed)
Subjective:   Kelly Blake is a 82 y.o. female who presents for Medicare Annual (Subsequent) preventive examination.  Review of Systems     Cardiac Risk Factors include: advanced age (>65mn, >>63women);diabetes mellitus     Objective:    Today's Vitals   10/24/22 1424  BP: 124/64  Pulse: 74  Temp: 98.1 F (36.7 C)  TempSrc: Oral  SpO2: 96%  Weight: 133 lb (60.3 kg)  Height: 5' (1.524 m)   Body mass index is 25.97 kg/m.     10/24/2022    2:31 PM 05/26/2021    2:09 PM 05/25/2020    2:07 PM 11/26/2016    7:51 PM 11/26/2016    2:06 PM 11/26/2016    9:22 AM 11/21/2016    1:27 PM  Advanced Directives  Does Patient Have a Medical Advance Directive? Yes Yes Yes  Yes Yes Yes  Type of AParamedicof AMinneolaLiving will HLindsayLiving will HKn[place]Living will HBrigham CityLiving will HChickaloonLiving will HGood[place]Living will HWahpetonLiving will  Does patient want to make changes to medical advance directive? No - Patient declined  No - Patient declined No - Patient declined     Copy of H[place]in Chart? Yes - validated most recent copy scanned in chart (See row information) Yes - validated most recent copy scanned in chart (See row information) Yes - validated most recent copy scanned in chart (See row information) Yes Yes Yes No - copy requested    Current Medications (verified) Outpatient Encounter Medications as of 10/24/2022  Medication Sig   amLODipine (NORVASC) 5 MG tablet Take 1 tablet (5 mg total) by mouth daily.   Cholecalciferol (VITAMIN D3) 10000 units TABS Take by mouth.   cyanocobalamin 500 MCG tablet Take 500 mcg daily by mouth.   metFORMIN (GLUCOPHAGE) 500 MG tablet TAKE ONE TABLET ONCE DAILY WITH BREAKFAST   pravastatin (PRAVACHOL) 20 MG tablet Take 1 tablet (20 mg total) by mouth daily.   zinc  gluconate 50 MG tablet Take 50 mg by mouth daily.   No facility-administered encounter medications on file as of 10/24/2022.    Allergies (verified) Patient has no known allergies.   History: Past Medical History:  Diagnosis Date   Allergy    Arthritis    Chicken pox    Chronic kidney disease    stones   Colon polyps    Diabetes mellitus without complication ([place]    Diverticulitis    Hypertension    Past Surgical History:  Procedure Laterality Date   BREAST SURGERY     right breast biopsy-benign   DILATION AND CURETTAGE OF UTERUS  2008   diverticulitis  2006   with abcess   KNEE ARTHROSCOPY  2003   torn meniscus   TOTAL KNEE ARTHROPLASTY Right 11/26/2016   Procedure: RIGHT TOTAL KNEE ARTHROPLASTY;  Surgeon: FGaynelle Arabian MD;  Location: [facility];  Service: Orthopedics;  Laterality: Right;   Family History  Problem Relation Age of Onset   Cancer Other        breast   Stroke Other    Diabetes Other    Alzheimer's disease Other    Cancer Mother    Diabetes Father    Social History   Socioeconomic History   Marital status: Married    Spouse name: Not on file   Number of children: Not on file   Years  of education: Not on file   Highest education level: Not on file  Occupational History   Not on file  Tobacco Use   Smoking status: Never   Smokeless tobacco: Never  Vaping Use   Vaping Use: Never used  Substance and Sexual Activity   Alcohol use: Yes    Comment: wine- 1 glass  daily or a , shot burbon daily occassionally   Drug use: No   Sexual activity: Not on file  Other Topics Concern   Not on file  Social History Narrative   Not on file   Social Determinants of Health   Financial Resource Strain: Low Risk  (10/24/2022)   Overall Financial Resource Strain (CARDIA)    Difficulty of Paying Living Expenses: Not hard at all  Food Insecurity: No Food Insecurity (10/24/2022)   Hunger Vital Sign    Worried About Running Out of Food in the Last Year: Never  true    Ran Out of Food in the Last Year: Never true  Transportation Needs: No Transportation Needs (10/24/2022)   PRAPARE - Hydrologist (Medical): No    Lack of Transportation (Non-Medical): No  Physical Activity: Insufficiently Active (10/24/2022)   Exercise Vital Sign    Days of Exercise per Week: 2 days    Minutes of Exercise per Session: 40 min  Stress: No Stress Concern Present (10/24/2022)   [place]    Feeling of Stress : Not at all  Social Connections: [place] (10/24/2022)   Social Connection and Isolation Panel [NHANES]    Frequency of Communication with Friends and Family: More than three times a week    Frequency of Social Gatherings with Friends and Family: More than three times a week    Attends Religious Services: More than 4 times per year    Active Member of [employer] or Organizations: Yes    Attends Music therapist: More than 4 times per year    Marital Status: Married    Tobacco Counseling Counseling given: Not Answered   Clinical Intake:  Pre-visit preparation completed: No  Pain : No/denies pain   Nutrition Risk Assessment:  Has the patient had any N/V/D within the last 2 months?  No  Does the patient have any non-healing wounds?  No  Has the patient had any unintentional weight loss or weight gain?  No   Diabetes:  Is the patient diabetic?  Yes  If diabetic, was a CBG obtained today?  No  Did the patient bring in their glucometer from home?  No  How often do you monitor your CBG's? PRN.   Financial Strains and Diabetes Management:  Are you having any financial strains with the device, your supplies or your medication? No .  Does the patient want to be seen by Chronic Care Management for management of their diabetes?  No  Would the patient like to be referred to a Nutritionist or for Diabetic Management?  No   Diabetic  Exams:  Diabetic Eye Exam: Completed No. Overdue for diabetic eye exam. Pt has been advised about the importance in completing this exam. A referral has been placed today. Message sent to referral coordinator for scheduling purposes. Advised pt to expect a call from office referred to regarding appt.  Diabetic Foot Exam: Completed No. Pt has been advised about the importance in completing this exam. Pt is scheduled for diabetic foot exam on Followed by PCP.  BMI - recorded: 25.97 Nutritional Status: BMI 25 -29 Overweight Nutritional Risks: None Diabetes: No  How often do you need to have someone help you when you read instructions, pamphlets, or other written materials from your doctor or pharmacy?: 1 - Never  Diabetic?  Yes  Interpreter Needed?: No  Information entered by :: Rolene Arbour LPN   Activities of Daily Living    10/24/2022    2:30 PM  In your present state of health, do you have any difficulty performing the following activities:  Hearing? 0  Vision? 0  Difficulty concentrating or making decisions? 0  Walking or climbing stairs? 0  Dressing or bathing? 0  Doing errands, shopping? 0  Preparing Food and eating ? N  Using the Toilet? N  In the past six months, have you accidently leaked urine? N  Do you have problems with loss of bowel control? N  Managing your Medications? N  Managing your Finances? N  Housekeeping or managing your Housekeeping? N    Patient Care Team: Eulas Post, MD as PCP - General (Family Medicine)  Indicate any recent Medical Services you may have received from other than [facility] providers in the past year (date may be approximate).     Assessment:   This is a routine wellness examination for [place].  Hearing/Vision screen Hearing Screening - Comments:: Denies hearing difficulties   Vision Screening - Comments:: Wears rx glasses - up to date with routine eye exams with  [place] issues and exercise activities  discussed: Exercise limited by: None identified   Goals Addressed               This Visit's Progress     Patient Stated (pt-stated)        I will continue to go to my exercise class Monday,and Wednesday for 1 hour.       Depression Screen    10/24/2022    2:14 PM 08/08/2022    3:11 PM 05/26/2021    2:11 PM 05/26/2021    2:05 PM 05/25/2020    2:11 PM 02/02/2020   10:23 AM 06/10/2018    2:32 PM  PHQ 2/9 Scores  PHQ - 2 Score 0 0 0 0 0 0 0  PHQ- 9 Score     0      Fall Risk    10/24/2022    2:31 PM 08/08/2022    3:10 PM 05/26/2021    2:11 PM 05/25/2020    2:10 PM 02/02/2020   10:23 AM  [place] in the past year? 0 0 0 0 0  Number falls in past yr: 0 0 0 0 0  Injury with Fall? 0 0 0 0 0  Risk for fall due to : No Fall Risks No Fall Risks  No Fall Risks   Follow up Falls prevention discussed Falls evaluation completed Falls evaluation completed Falls evaluation completed;Falls prevention discussed Falls evaluation completed    FALL RISK PREVENTION PERTAINING TO THE HOME:  Any stairs in or around the home? Yes  If so, are there any without handrails? No  Home free of loose throw rugs in walkways, pet beds, electrical cords, etc? Yes  Adequate lighting in your home to reduce risk of falls? Yes   ASSISTIVE DEVICES UTILIZED TO PREVENT FALLS:  Life alert? No  Use of a cane, walker or w/c? No  Grab bars in the bathroom? No  Shower chair or bench in shower? No  Elevated toilet seat or a handicapped toilet? No   TIMED UP AND GO:  Was the test performed? Yes .  Length of time to ambulate 10 feet: 10 sec.   Gait steady and fast without use of assistive device  Cognitive Function:        10/24/2022    2:31 PM  6CIT Screen  What Year? 0 points  What month? 0 points  What time? 0 points  Count back from 20 0 points  Months in reverse 0 points  Repeat phrase 0 points  Total Score 0 points    Immunizations Immunization History  Administered Date(s)  Administered   Fluad Quad(high Dose 65+) 05/06/2019, 06/15/2020, 05/26/2021   Influenza Split 06/21/2011, 05/21/2012   Influenza, High Dose Seasonal PF 05/27/2015, 06/11/2016, 06/28/2017, 06/10/2018   Influenza,inj,Quad PF,6+ Mos 06/03/2013, 06/04/2014   Moderna Sars-Covid-2 Vaccination 09/15/2019, 10/13/2019   Pneumococcal Conjugate-13 12/22/2014   Pneumococcal Polysaccharide-23 04/02/2017   Tdap 06/21/2011    TDAP status: Due, Education has been provided regarding the importance of this vaccine. Advised may receive this vaccine at local pharmacy or Health Dept. Aware to provide a copy of the vaccination record if obtained from local pharmacy or Health Dept. Verbalized acceptance and understanding.  Flu Vaccine status: Declined, Education has been provided regarding the importance of this vaccine but patient still declined. Advised may receive this vaccine at local pharmacy or Health Dept. Aware to provide a copy of the vaccination record if obtained from local pharmacy or Health Dept. Verbalized acceptance and understanding.  Pneumococcal vaccine status: Up to date  Covid-19 vaccine status: Completed vaccines  Qualifies for Shingles Vaccine? Yes   Zostavax completed No   Shingrix Completed?: No.    Education has been provided regarding the importance of this vaccine. Patient has been advised to call insurance company to determine out of pocket expense if they have not yet received this vaccine. Advised may also receive vaccine at local pharmacy or Health Dept. Verbalized acceptance and understanding.  Screening Tests Health Maintenance  Topic Date Due   DTaP/Tdap/Td (2 - Td or Tdap) 06/20/2021   OPHTHALMOLOGY EXAM  10/24/2022 (Originally 12/07/2021)   FOOT EXAM  10/25/2022 (Originally 06/23/2022)   COVID-19 Vaccine (3 - 2023-24 season) 11/09/2022 (Originally 05/04/2022)   INFLUENZA VACCINE  12/02/2022 (Originally 04/03/2022)   Zoster Vaccines- Shingrix (1 of 2) 01/22/2023 (Originally  04/25/1990)   HEMOGLOBIN A1C  02/07/2023   Diabetic kidney evaluation - eGFR measurement  08/09/2023   Diabetic kidney evaluation - Urine ACR  08/09/2023   Medicare Annual Wellness (AWV)  10/25/2023   Pneumonia Vaccine 31+ Years old  Completed   DEXA SCAN  Completed   HPV VACCINES  Aged Out    Health Maintenance  Health Maintenance Due  Topic Date Due   DTaP/Tdap/Td (2 - Td or Tdap) 06/20/2021    Colorectal cancer screening: No longer required.   Mammogram status: No longer required due to Age.  Bone Density status: Completed 12/22/14. Results reflect: Bone density results: OSTEOPOROSIS. Repeat every   years.  Lung Cancer Screening: (Low Dose CT Chest recommended if Age 66-80 years, 30 pack-year currently smoking OR have quit w/in 15years.) does not qualify.     Additional Screening:  Hepatitis C Screening: does not qualify; Completed   Vision Screening: Recommended annual ophthalmology exams for early detection of glaucoma and other disorders of the eye. Is the patient up to date with their annual eye exam?  Yes  Who is the provider or what is  the name of the office in which the patient attends annual eye exams? [facility] If pt is not established with a provider, would they like to be referred to a provider to establish care? No .   Dental Screening: Recommended annual dental exams for proper oral hygiene  Community Resource Referral / Chronic Care Management:  CRR required this visit?  No   CCM required this visit?  No      Plan:     I have personally reviewed and noted the following in the patient's chart:   Medical and social history Use of alcohol, tobacco or illicit drugs  Current medications and supplements including opioid prescriptions. Patient is not currently taking opioid prescriptions. Functional ability and status Nutritional status Physical activity Advanced directives List of other physicians Hospitalizations, surgeries, and ER visits in  previous 12 months Vitals Screenings to include cognitive, depression, and falls Referrals and appointments  In addition, I have reviewed and discussed with patient certain preventive protocols, quality metrics, and best practice recommendations. A written personalized care plan for preventive services as well as general preventive health recommendations were provided to patient.     Criselda Peaches, LPN   [id number]   Nurse Notes: None

## 2023-01-25 ENCOUNTER — Other Ambulatory Visit: Payer: Self-pay | Admitting: Family Medicine

## 2023-02-05 ENCOUNTER — Ambulatory Visit: Payer: Medicare HMO | Admitting: Family Medicine

## 2023-02-05 DIAGNOSIS — R051 Acute cough: Secondary | ICD-10-CM | POA: Diagnosis not present

## 2023-02-05 DIAGNOSIS — Z6825 Body mass index (BMI) 25.0-25.9, adult: Secondary | ICD-10-CM | POA: Diagnosis not present

## 2023-03-01 ENCOUNTER — Other Ambulatory Visit: Payer: Self-pay | Admitting: Family Medicine

## 2023-04-17 ENCOUNTER — Telehealth: Payer: Self-pay | Admitting: Family Medicine

## 2023-04-17 NOTE — Telephone Encounter (Addendum)
Pt's son, Salsabil Sharpe  [phone]) called to say he is having some very real concerns about his mother.  Pt has been having some cognitive issues and memory loss, for some time.  But now, she is experiencing some sort of break (especially at night) where she thinks her husband of several decades (and, who is laying next to her) is a stranger in her bed, and she gets very upset.   Son would like to know if there is some way we can call Pt and tell her to come in for a visit?  LOV:  08/08/22  Son says she/they will not call for an appointment, on their own, to seek medical advice.   Please advise.

## 2023-04-19 NOTE — Telephone Encounter (Signed)
Noted appointment was scheduled for 30 mins

## 2023-04-20 ENCOUNTER — Other Ambulatory Visit: Payer: Self-pay | Admitting: Family Medicine

## 2023-04-22 ENCOUNTER — Ambulatory Visit: Payer: Medicare HMO | Admitting: Family Medicine

## 2023-05-02 ENCOUNTER — Other Ambulatory Visit: Payer: Self-pay | Admitting: Family Medicine

## 2023-05-24 ENCOUNTER — Other Ambulatory Visit: Payer: Self-pay | Admitting: Family Medicine

## 2023-06-25 DIAGNOSIS — H401131 Primary open-angle glaucoma, bilateral, mild stage: Secondary | ICD-10-CM | POA: Diagnosis not present

## 2023-06-25 DIAGNOSIS — H26492 Other secondary cataract, left eye: Secondary | ICD-10-CM | POA: Diagnosis not present

## 2023-07-14 ENCOUNTER — Other Ambulatory Visit: Payer: Self-pay | Admitting: Family Medicine

## 2023-07-18 ENCOUNTER — Ambulatory Visit: Payer: Medicare HMO | Admitting: Internal Medicine

## 2023-07-18 ENCOUNTER — Encounter: Payer: Self-pay | Admitting: Internal Medicine

## 2023-07-18 VITALS — BP 130/80 | HR 76 | Temp 98.2°F | Wt 131.7 lb

## 2023-07-18 DIAGNOSIS — R059 Cough, unspecified: Secondary | ICD-10-CM | POA: Diagnosis not present

## 2023-07-18 DIAGNOSIS — J069 Acute upper respiratory infection, unspecified: Secondary | ICD-10-CM | POA: Diagnosis not present

## 2023-07-18 LAB — POC COVID19 BINAXNOW: SARS Coronavirus 2 Ag: NEGATIVE

## 2023-07-18 LAB — POCT INFLUENZA A/B
Influenza A, POC: NEGATIVE
Influenza B, POC: NEGATIVE

## 2023-07-18 MED ORDER — BENZONATATE 100 MG PO CAPS
100.0000 mg | ORAL_CAPSULE | Freq: Two times a day (BID) | ORAL | 0 refills | Status: DC | PRN
Start: 2023-07-18 — End: 2023-10-15

## 2023-07-18 NOTE — Progress Notes (Signed)
Established Patient Office Visit     CC/Reason for Visit: Cough  HPI: Kelly Blake is a 82 y.o. female who is coming in today for the above mentioned reasons.  For the past 3 days has had a cough productive of clear/white sputum.  No fever or other URI symptoms.  Significant postnasal drip.  No sick contacts or recent travel.  She is not known to have fall allergies.   Past Medical/Surgical History: Past Medical History:  Diagnosis Date   Allergy    Arthritis    Chicken pox    Chronic kidney disease    stones   Colon polyps    Diabetes mellitus without complication (HCC)    Diverticulitis    Hypertension     Past Surgical History:  Procedure Laterality Date   BREAST SURGERY     right breast biopsy-benign   DILATION AND CURETTAGE OF UTERUS  2008   diverticulitis  2006   with abcess   KNEE ARTHROSCOPY  2003   torn meniscus   TOTAL KNEE ARTHROPLASTY Right 11/26/2016   Procedure: RIGHT TOTAL KNEE ARTHROPLASTY;  Surgeon: Ollen Gross, MD;  Location: [facility];  Service: Orthopedics;  Laterality: Right;    Social History:  reports that she has never smoked. She has never used smokeless tobacco. She reports current alcohol use. She reports that she does not use drugs.  Allergies: No Known Allergies  Family History:  Family History  Problem Relation Age of Onset   Cancer Other        breast   Stroke Other    Diabetes Other    Alzheimer's disease Other    Cancer Mother    Diabetes Father      Current Outpatient Medications:    amLODipine (NORVASC) 5 MG tablet, TAKE 1 TABLET (5 MG TOTAL) BY MOUTH DAILY., Disp: 30 tablet, Rfl: 0   benzonatate (TESSALON) 100 MG capsule, Take 1 capsule (100 mg total) by mouth 2 (two) times daily as needed for cough., Disp: 20 capsule, Rfl: 0   Cholecalciferol (VITAMIN D3) 10000 units TABS, Take by mouth., Disp: , Rfl:    cyanocobalamin 500 MCG tablet, Take 500 mcg daily by mouth., Disp: , Rfl:    metFORMIN (GLUCOPHAGE) 500 MG  tablet, TAKE ONE TABLET ONCE DAILY WITH BREAKFAST, Disp: 90 tablet, Rfl: 0   pravastatin (PRAVACHOL) 20 MG tablet, TAKE 1 TABLET BY MOUTH EVERY DAY, Disp: 90 tablet, Rfl: 0   zinc gluconate 50 MG tablet, Take 50 mg by mouth daily., Disp: , Rfl:   Review of Systems:  Negative unless indicated in HPI.   Physical Exam: Vitals:   07/18/23 1023  BP: 130/80  Pulse: 76  Temp: 98.2 F (36.8 C)  TempSrc: Oral  SpO2: 98%  Weight: 131 lb 11.2 oz (59.7 kg)    Body mass index is 25.72 kg/m.   Physical Exam Vitals reviewed.  Constitutional:      Appearance: Normal appearance.  HENT:     Right Ear: Tympanic membrane, ear canal and external ear normal.     Left Ear: Tympanic membrane, ear canal and external ear normal.     Mouth/Throat:     Mouth: Mucous membranes are moist.     Pharynx: Oropharynx is clear. Posterior oropharyngeal erythema present.  Eyes:     Conjunctiva/sclera: Conjunctivae normal.     Pupils: Pupils are equal, round, and reactive to light.  Cardiovascular:     Rate and Rhythm: Normal rate and regular rhythm.  Pulmonary:     Effort: Pulmonary effort is normal.     Breath sounds: Normal breath sounds.  Neurological:     Mental Status: She is alert.      Impression and Plan:  Viral URI with cough -     Benzonatate; Take 1 capsule (100 mg total) by mouth 2 (two) times daily as needed for cough.  Dispense: 20 capsule; Refill: 0  Cough, unspecified type -     POCT Influenza A/B -     POC COVID-19 BinaxNow   -In office flu and COVID tests are negative. -Tessalon Perles for cough. -Given exam findings, PNA, pharyngitis, ear infection are not likely, hence abx have not been prescribed. -Have advised rest, fluids, OTC antihistamines, cough suppressants and mucinex. -RTC if no improvement in 10-14 days.   Time spent:21 minutes reviewing chart, interviewing and examining patient and formulating plan of care.     Chaya Jan, MD [facility] at [facility]

## 2023-07-19 ENCOUNTER — Telehealth: Payer: Self-pay | Admitting: Family Medicine

## 2023-07-19 MED ORDER — HYDROCODONE BIT-HOMATROP MBR 5-1.5 MG/5ML PO SOLN: 5.0000 mL | Freq: Three times a day (TID) | ORAL | 0 refills | Status: DC | PRN

## 2023-07-19 NOTE — Telephone Encounter (Signed)
Patient informed of the message below and voiced understanding  

## 2023-07-19 NOTE — Telephone Encounter (Signed)
I sent in limited Hycodan cough syrup 1 teaspoon nightly as needed for severe cough.  This may cause sedation and dizziness  Kristian Covey MD [facility] at [facility]

## 2023-07-19 NOTE — Addendum Note (Signed)
Addended by: Kristian Covey on: 07/19/2023 01:02 PM   Modules accepted: Orders

## 2023-07-19 NOTE — Telephone Encounter (Signed)
Pt requesting a substitution for benzonatate (TESSALON) 100 MG capsule. Says they are not working for her, she has lost sleep from the cough.

## 2023-07-23 DIAGNOSIS — H401132 Primary open-angle glaucoma, bilateral, moderate stage: Secondary | ICD-10-CM | POA: Diagnosis not present

## 2023-08-08 ENCOUNTER — Other Ambulatory Visit: Payer: Self-pay | Admitting: Family Medicine

## 2023-08-16 ENCOUNTER — Other Ambulatory Visit: Payer: Self-pay | Admitting: Family Medicine

## 2023-09-12 ENCOUNTER — Other Ambulatory Visit: Payer: Self-pay | Admitting: Family Medicine

## 2023-10-05 ENCOUNTER — Other Ambulatory Visit: Payer: Self-pay | Admitting: Family Medicine

## 2023-10-09 ENCOUNTER — Other Ambulatory Visit: Payer: Self-pay | Admitting: Family Medicine

## 2023-10-11 ENCOUNTER — Other Ambulatory Visit: Payer: Self-pay | Admitting: Family Medicine

## 2023-10-15 ENCOUNTER — Encounter: Payer: Self-pay | Admitting: Family Medicine

## 2023-10-15 ENCOUNTER — Ambulatory Visit (INDEPENDENT_AMBULATORY_CARE_PROVIDER_SITE_OTHER): Payer: Medicare HMO | Admitting: Family Medicine

## 2023-10-15 VITALS — BP 160/82 | HR 76 | Temp 97.7°F | Ht 60.0 in | Wt 132.8 lb

## 2023-10-15 DIAGNOSIS — E785 Hyperlipidemia, unspecified: Secondary | ICD-10-CM | POA: Diagnosis not present

## 2023-10-15 DIAGNOSIS — I10 Essential (primary) hypertension: Secondary | ICD-10-CM

## 2023-10-15 DIAGNOSIS — Z7984 Long term (current) use of oral hypoglycemic drugs: Secondary | ICD-10-CM

## 2023-10-15 DIAGNOSIS — E1136 Type 2 diabetes mellitus with diabetic cataract: Secondary | ICD-10-CM | POA: Diagnosis not present

## 2023-10-15 LAB — COMPREHENSIVE METABOLIC PANEL
ALT: 13 U/L (ref 0–35)
AST: 18 U/L (ref 0–37)
Albumin: 4.7 g/dL (ref 3.5–5.2)
Alkaline Phosphatase: 76 U/L (ref 39–117)
BUN: 18 mg/dL (ref 6–23)
CO2: 28 meq/L (ref 19–32)
Calcium: 9.7 mg/dL (ref 8.4–10.5)
Chloride: 103 meq/L (ref 96–112)
Creatinine, Ser: 0.65 mg/dL (ref 0.40–1.20)
GFR: 81.39 mL/min (ref >60.00)
Glucose, Bld: 137 mg/dL — ABNORMAL HIGH (ref 70–99)
Potassium: 4.1 meq/L (ref 3.5–5.1)
Sodium: 138 meq/L (ref 135–145)
Total Bilirubin: 0.9 mg/dL (ref 0.2–1.2)
Total Protein: 7.5 g/dL (ref 6.0–8.3)

## 2023-10-15 LAB — LIPID PANEL
Cholesterol: 188 mg/dL (ref 0–200)
HDL: 72.3 mg/dL (ref >39.00)
LDL Cholesterol: 101 mg/dL — ABNORMAL HIGH (ref 0–99)
NonHDL: 115.34
Total CHOL/HDL Ratio: 3
Triglycerides: 74 mg/dL (ref 0.0–149.0)
VLDL: 14.8 mg/dL (ref 0.0–40.0)

## 2023-10-15 LAB — POCT GLYCOSYLATED HEMOGLOBIN (HGB A1C): Hemoglobin A1C: 6.9 % — AB (ref 4.0–5.6)

## 2023-10-15 MED ORDER — AMLODIPINE BESYLATE 5 MG PO TABS: 5.0000 mg | ORAL_TABLET | Freq: Every day | ORAL | 3 refills | Status: DC

## 2023-10-15 MED ORDER — PRAVASTATIN SODIUM 20 MG PO TABS: 20.0000 mg | ORAL_TABLET | Freq: Every day | ORAL | 3 refills | Status: AC

## 2023-10-15 MED ORDER — METFORMIN HCL 500 MG PO TABS: ORAL_TABLET | ORAL | 3 refills | Status: DC

## 2023-10-15 NOTE — Patient Instructions (Addendum)
Check BP at home at record readings.    Be in touch if consistently > 140/90.    Set up one month follow up and bring your cuff at that time.

## 2023-10-15 NOTE — Progress Notes (Signed)
Established Patient Office Visit  Subjective   Patient ID: Kelly Blake, female    DOB: 1940-10-22  Age: 82 y.o. MRN: [id number]  Chief Complaint  Patient presents with   Medication Refill    HPI   Kelly Blake has history of type 2 diabetes, hypertension, hyperlipidemia.  Seen today for medical follow-up.  Her current medications include pravastatin 20 mg daily, amlodipine 5 mg daily, metformin 500 mg daily and vitamin supplements.  She states she has been taking her medications regularly.  Denies any recent dizziness or falls.  Not monitoring blood sugars regularly.  Blood sugars been relatively well-controlled on low-dose metformin for quite some time.  Patient does need refills of amlodipine today.  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 She has never used smokeless tobacco. She reports current alcohol use. She reports that she does not use drugs. family history includes Alzheimer's disease in an other family member; Cancer in her mother and another family member; Diabetes in her father and another family member; Stroke in an other family member. No Known Allergies  Review of Systems  Constitutional:  Negative for chills, fever and malaise/fatigue.  Eyes:  Negative for blurred vision.  Respiratory:  Negative for shortness of breath.   Cardiovascular:  Negative for  chest pain.  Gastrointestinal:  Negative for abdominal pain.  Genitourinary:  Negative for dysuria.  Neurological:  Negative for dizziness, weakness and headaches.      Objective:     BP (!) 160/82 (BP Location: Left Arm, Patient Position: Sitting, Cuff Size: Normal)   Pulse 76   Temp 97.7 F (36.5 C) (Oral)   Ht 5' (1.524 m)   Wt 132 lb 12.8 oz (60.2 kg)   SpO2 97%   BMI 25.94 kg/m  BP Readings from Last 3 Encounters:  10/15/23 (!) 160/82  07/18/23 130/80  10/24/22 124/64   Wt Readings from Last 3 Encounters:  10/15/23 132 lb 12.8 oz (60.2 kg)  07/18/23 131 lb 11.2 oz (59.7 kg)  10/24/22 133 lb (60.3 kg)      Physical Exam Vitals reviewed.  Constitutional:      Appearance: She is well-developed.  Eyes:     Pupils: Pupils are equal, round, and reactive to light.  Neck:     Thyroid: No thyromegaly.     Vascular: No JVD.  Cardiovascular:     Rate and Rhythm: Normal rate and regular rhythm.     Heart sounds:     No gallop.  Pulmonary:     Effort: Pulmonary effort is normal. No respiratory distress.     Breath sounds: Normal breath sounds. No wheezing or rales.  Musculoskeletal:     Cervical back: Neck supple.     Right lower leg: No  edema.     Left lower leg: No edema.  Neurological:     Mental Status: She is alert.      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  She thinks this is atypical for her.  She has home cuff and plans to monitor regularly next few weeks.  We have recommended 1 month follow-up and bring her cuff and along with readings to review.  If still up at that time consider additional medication.  Refills of amlodipine sent in for 1 year  -A1c today 6.9% which is stable.  No change in medication at this point.  Continue yearly diabetic eye exam.  Check urine microalbumin screen  -Check lipid and CMP with her pravastatin therapy. Return in about 1 month (around 11/12/2023).    [name], MD

## 2023-10-17 LAB — MICROALBUMIN / CREATININE URINE RATIO
Creatinine,U: 59.4 mg/dL
Microalb Creat Ratio: 23.7 mg/g (ref 0.0–30.0)
Microalb, Ur: 1.4 mg/dL (ref 0.0–1.9)

## 2023-10-22 DIAGNOSIS — H401132 Primary open-angle glaucoma, bilateral, moderate stage: Secondary | ICD-10-CM | POA: Diagnosis not present

## 2024-01-13 ENCOUNTER — Ambulatory Visit (INDEPENDENT_AMBULATORY_CARE_PROVIDER_SITE_OTHER): Payer: Medicare HMO

## 2024-01-13 ENCOUNTER — Telehealth: Payer: Self-pay | Admitting: Family Medicine

## 2024-01-13 VITALS — BP 120/60 | HR 74 | Temp 98.2°F | Ht 61.0 in | Wt 134.3 lb

## 2024-01-13 DIAGNOSIS — Z0000 Encounter for general adult medical examination without abnormal findings: Secondary | ICD-10-CM | POA: Diagnosis not present

## 2024-01-13 NOTE — Progress Notes (Signed)
 Subjective:   Tanara Penafiel is a 83 y.o. who presents for a Medicare Wellness preventive visit.  As a reminder, Annual Wellness Visits don't include a physical exam, and some assessments may be limited, especially if this visit is performed virtually. We may recommend an in-person visit if needed.  Visit Complete: In person    Persons Participating in Visit: Patient.  AWV Questionnaire: No: Patient Medicare AWV questionnaire was not completed prior to this visit.  Cardiac Risk Factors include: advanced age (>65men, >31 women);diabetes mellitus;hypertension     Objective:     Today's Vitals   01/13/24 1527  BP: 120/60  Pulse: 74  Temp: 98.2 F (36.8 C)  TempSrc: Oral  SpO2: 94%  Weight: 134 lb 4.8 oz (60.9 kg)  Height: 5\' 1"  (1.549 m)   Body mass index is 25.38 kg/m.     01/13/2024    3:47 PM 10/24/2022    2:31 PM 05/26/2021    2:09 PM 05/25/2020    2:07 PM 11/26/2016    7:51 PM 11/26/2016    2:06 PM 11/26/2016    9:22 AM  Advanced Directives  Does Patient Have a Medical Advance Directive? Yes Yes Yes Yes  Yes Yes  Type of Estate agent of [place];Living will Healthcare Power of [place];Living will Healthcare Power of [place];Living will Healthcare Power of [place];Living will Healthcare Power of [place];Living will Healthcare Power of [place];Living will Healthcare Power of [place];Living will  Does patient want to make changes to medical advance directive?  No - Patient declined  No - Patient declined No - Patient declined    Copy of Healthcare Power of Attorney in Chart? No - copy requested Yes - validated most recent copy scanned in chart (See row information) Yes - validated most recent copy scanned in chart (See row information) Yes - validated most recent copy scanned in chart (See row information) Yes Yes Yes    Current Medications (verified) Outpatient Encounter Medications as of 01/13/2024  Medication Sig   amLODipine  (NORVASC ) 5  MG tablet Take 1 tablet (5 mg total) by mouth daily.   Cholecalciferol (VITAMIN D3) 10000 units TABS Take by mouth.   cyanocobalamin 500 MCG tablet Take 500 mcg daily by mouth.   metFORMIN  (GLUCOPHAGE ) 500 MG tablet TAKE ONE TABLET ONCE DAILY WITH BREAKFAST   pravastatin  (PRAVACHOL ) 20 MG tablet Take 1 tablet (20 mg total) by mouth daily.   zinc gluconate 50 MG tablet Take 50 mg by mouth daily.   No facility-administered encounter medications on file as of 01/13/2024.    Allergies (verified) Patient has no known allergies.   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   Procedure: RIGHT TOTAL KNEE ARTHROPLASTY;  Surgeon: Liliane Rei, MD;  Location: [facility];  Service: Orthopedics;  Laterality: Right;   Family History  Problem Relation Age of Onset   Cancer Other        breast  Stroke Other    Diabetes Other    Alzheimer's disease Other    Cancer Mother    Diabetes Father    Social History   Socioeconomic History   Marital status: Married    Spouse name: Not on file   Number of children: Not on file   Years of education: Not on file   Highest education level: Not on file  Occupational History   Not on file  Tobacco Use   Smoking status: Never   Smokeless tobacco: Never  Vaping Use   Vaping status: Never Used  Substance and Sexual Activity   Alcohol use: Yes    Comment: wine- 1 glass  daily or a , shot burbon daily occassionally   Drug use: No   Sexual activity: Not on file  Other Topics Concern   Not on file  Social  History Narrative   Not on file   Social Drivers of Health   Financial Resource Strain: Low Risk  (01/13/2024)   Overall Financial Resource Strain (CARDIA)    Difficulty of Paying Living Expenses: Not hard at all  Food Insecurity: No Food Insecurity (01/13/2024)   Hunger Vital Sign    Worried About Running Out of Food in the Last Year: Never true    Ran Out of Food in the Last Year: Never true  Transportation Needs: No Transportation Needs (01/13/2024)   PRAPARE - Administrator, Civil Service (Medical): No    Lack of Transportation (Non-Medical): No  Physical Activity: Insufficiently Active (01/13/2024)   Exercise Vital Sign    Days of Exercise per Week: 2 days    Minutes of Exercise per Session: 50 min  Stress: No Stress Concern Present (01/13/2024)   [employer] of Occupational Health - Occupational Stress Questionnaire    Feeling of Stress : Not at all  Social Connections: Socially Integrated (01/13/2024)   Social Connection and Isolation Panel [NHANES]    Frequency of Communication with Friends and Family: More than three times a week    Frequency of Social Gatherings with Friends and Family: More than three times a week    Attends Religious Services: More than 4 times per year    Active Member of [employer] or Organizations: Yes    Attends Engineer, structural: More than 4 times per year    Marital Status: Married    Tobacco Counseling Counseling given: Not Answered    Clinical Intake:  Pre-visit preparation completed: Yes  Pain : No/denies pain     BMI - recorded: 25.38 Nutritional Status: BMI 25 -29 Overweight Nutritional Risks: None Diabetes: Yes CBG done?: No Did pt. bring in CBG monitor from home?: No  Lab Results  Component Value Date   HGBA1C 6.9 (A) 10/15/2023   HGBA1C 7.1 (H) 08/08/2022   HGBA1C 6.6 (A) 04/18/2022     How often do you need to have someone help you when you read instructions, pamphlets, or other written  materials from your doctor or pharmacy?: 1 - Never  Interpreter Needed?: No  Information entered by :: Farris Hong LPN   Activities of Daily Living     01/13/2024    3:45 PM  In your present state of health, do you have any difficulty performing the following activities:  Hearing? 0  Vision? 0  Difficulty concentrating or making decisions? 0  Walking or climbing stairs? 0  Dressing or bathing? 0  Doing errands, shopping? 0  Preparing Food and eating ? N  Using  the Toilet? N  In the past six months, have you accidently leaked urine? N  Do you have problems with loss of bowel control? N  Managing your Medications? N  Managing your Finances? N  Housekeeping or managing your Housekeeping? N    Patient Care Team: Marquetta Sit, MD as PCP - General (Family Medicine)  Indicate any recent Medical Services you may have received from other than [facility] providers in the past year (date may be approximate).     Assessment:    This is a routine wellness examination for Mimma.  Hearing/Vision screen Hearing Screening - Comments:: Denies hearing difficulties   Vision Screening - Comments:: Wears rx glasses - up to date with routine eye exams with  Dr Johnetta Nab   Goals Addressed               This Visit's Progress     Increase physical activity (pt-stated)        Remain active.       Depression Screen  +    01/13/2024    3:29 PM 10/15/2023   10:56 AM 07/18/2023   10:48 AM 10/24/2022    2:14 PM 08/08/2022    3:11 PM 05/26/2021    2:11 PM 05/26/2021    2:05 PM  PHQ 2/9 Scores  PHQ - 2 Score 0 0 0 0 0 0 0  PHQ- 9 Score  0 1        Fall Risk     01/13/2024    3:46 PM 10/15/2023   10:55 AM 07/18/2023   10:48 AM 10/24/2022    2:31 PM 08/08/2022    3:10 PM  Fall Risk   Falls in the past year? 0 0 0 0 0  Number falls in past yr: 0 0 0 0 0  Injury with Fall? 0 0 0 0 0  Risk for fall due to : No Fall Risks   No Fall Risks No Fall Risks  Follow up Falls prevention  discussed;Falls evaluation completed  Falls evaluation completed Falls prevention discussed Falls evaluation completed    MEDICARE RISK AT HOME:  Medicare Risk at Home Any stairs in or around the home?: No If so, are there any without handrails?: No Home free of loose throw rugs in walkways, pet beds, electrical cords, etc?: Yes Adequate lighting in your home to reduce risk of falls?: Yes Life alert?: No Use of a cane, walker or w/c?: No Grab bars in the bathroom?: Yes Shower chair or bench in shower?: No Elevated toilet seat or a handicapped toilet?: No  TIMED UP AND GO:  Was the test performed?  Yes  Length of time to ambulate 10 feet: 10 sec Gait steady and fast without use of assistive device  Cognitive Function: 6CIT completed        01/13/2024    3:47 PM 10/24/2022    2:31 PM  6CIT Screen  What Year? 0 points 0 points  What month? 0 points 0 points  What time? 0 points 0 points  Count back from 20 0 points 0 points  Months in reverse 0 points 0 points  Repeat phrase 0 points 0 points  Total Score 0 points 0 points    Immunizations Immunization History  Administered Date(s) Administered   Fluad Quad(high Dose 65+) 05/06/2019, 06/15/2020, 05/26/2021   Fluad Trivalent(High Dose 65+) 08/07/2023   Influenza Split 06/21/2011, 05/21/2012   Influenza, High Dose Seasonal PF 05/27/2015, 06/11/2016, 06/28/2017, 06/10/2018   Influenza,inj,Quad PF,6+ Mos 06/03/2013,  06/04/2014   Moderna Sars-Covid-2 Vaccination 09/15/2019, 10/13/2019   Pneumococcal Conjugate-13 12/22/2014   Pneumococcal Polysaccharide-23 04/02/2017   Tdap 06/21/2011    Screening Tests Health Maintenance  Topic Date Due   Zoster Vaccines- Shingrix (1 of 2) Never done   DTaP/Tdap/Td (2 - Td or Tdap) 06/20/2021   FOOT EXAM  06/23/2022   COVID-19 Vaccine (3 - 2024-25 season) 05/05/2023   OPHTHALMOLOGY EXAM  02/02/2024 (Originally 12/07/2021)   INFLUENZA VACCINE  04/03/2024   HEMOGLOBIN A1C  04/13/2024    Diabetic kidney evaluation - eGFR measurement  10/14/2024   Diabetic kidney evaluation - Urine ACR  10/14/2024   Medicare Annual Wellness (AWV)  01/12/2025   Pneumonia Vaccine 35+ Years old  Completed   DEXA SCAN  Completed   HPV VACCINES  Aged Out   Meningococcal B Vaccine  Aged Out    Health Maintenance  Health Maintenance Due  Topic Date Due   Zoster Vaccines- Shingrix (1 of 2) Never done   DTaP/Tdap/Td (2 - Td or Tdap) 06/20/2021   FOOT EXAM  06/23/2022   COVID-19 Vaccine (3 - 2024-25 season) 05/05/2023   Health Maintenance Items Addressed: Zoster/Shingles and Dtap/Tdap vaccines deferred  Additional Screening:  Vision Screening: Recommended annual ophthalmology exams for early detection of glaucoma and other disorders of the eye.  Dental Screening: Recommended annual dental exams for proper oral hygiene  Community Resource Referral / Chronic Care Management: CRR required this visit?  No   CCM required this visit?  No   Plan:    I have personally reviewed and noted the following in the patient's chart:   Medical and social history Use of alcohol, tobacco or illicit drugs  Current medications and supplements including opioid prescriptions. Patient is not currently taking opioid prescriptions. Functional ability and status Nutritional status Physical activity Advanced directives List of other physicians Hospitalizations, surgeries, and ER visits in previous 12 months Vitals Screenings to include cognitive, depression, and falls Referrals and appointments  In addition, I have reviewed and discussed with patient certain preventive protocols, quality metrics, and best practice recommendations. A written personalized care plan for preventive services as well as general preventive health recommendations were provided to patient.   Dewayne Ford, LPN   02/05/5408   After Visit Summary: (In Person-Printed) AVS printed and given to the patient  Notes: Nothing  significant to report at this time.

## 2024-01-13 NOTE — Patient Instructions (Addendum)
 Kelly Blake , Thank you for taking time out of your busy schedule to complete your Annual Wellness Visit with me. I enjoyed our conversation and look forward to speaking with you again next year. I, as well as your care team,  appreciate your ongoing commitment to your health goals. Please review the following plan we discussed and let me know if I can assist you in the future. Your Game plan/ To Do List    Referrals: If you haven't heard from the office you've been referred to, please reach out to them at the phone provided.   Follow up Visits: Next Medicare AWV with our clinical staff:   01/18/25 @ 3p Have you seen your provider in the last 6 months (3 months if uncontrolled diabetes)? Yes 10/15/23 Next Office Visit with your provider: Deferred  Clinician Recommendations:  Aim for 30 minutes of exercise or brisk walking, 6-8 glasses of water, and 5 servings of fruits and vegetables each day.       This is a list of the screening recommended for you and due dates:  Health Maintenance  Topic Date Due   Zoster (Shingles) Vaccine (1 of 2) Never done   DTaP/Tdap/Td vaccine (2 - Td or Tdap) 06/20/2021   Complete foot exam   06/23/2022   COVID-19 Vaccine (3 - 2024-25 season) 05/05/2023   Eye exam for diabetics  02/02/2024*   Flu Shot  04/03/2024   Hemoglobin A1C  04/13/2024   Yearly kidney function blood test for diabetes  10/14/2024   Yearly kidney health urinalysis for diabetes  10/14/2024   Medicare Annual Wellness Visit  01/12/2025   Pneumonia Vaccine  Completed   DEXA scan (bone density measurement)  Completed   HPV Vaccine  Aged Out   Meningitis B Vaccine  Aged Out  *Topic was postponed. The date shown is not the original due date.    Advanced directives: (Copy Requested) Please bring a copy of your health care power of attorney and living will to the office to be added to your chart at your convenience. You can mail to [facility] [address] [place], [place]  [address] or email to [email or web address] Advance Care Planning is important because it:  [x]  Makes sure you receive the medical care that is consistent with your values, goals, and preferences  [x]  It provides guidance to your family and loved ones and reduces their decisional burden about whether or not they are making the right decisions based on your wishes.  Follow the link provided in your after visit summary or read over the paperwork we have mailed to you to help you started getting your Advance Directives in place. If you need assistance in completing these, please reach out to us  so that we can help you!  See attachments for Preventive Care and Fall Prevention Tips.

## 2024-01-13 NOTE — Telephone Encounter (Signed)
 Pt son Ret came in with concerns about his parents specifically his mom. Wrote a letter for Dr and would really like to keep it confidential only because his mom would get mad if she found out he was here.   He stated all of his concerns are in the letter that was placed in the prov. folder. Wondering if there is anything you can do to help her without her knowing that he brought up the concerns.   Please advise.

## 2024-01-14 NOTE — Telephone Encounter (Signed)
 I spoke with the patient's son Georgette Kins and informed him of the message below. I reached out to patient's other son Rett to advise him of the message below as well. Left vm for him to return my call.

## 2024-01-14 NOTE — Telephone Encounter (Signed)
Left a message for the patient's son to return my call.

## 2024-02-03 DIAGNOSIS — H401132 Primary open-angle glaucoma, bilateral, moderate stage: Secondary | ICD-10-CM | POA: Diagnosis not present

## 2024-06-15 DIAGNOSIS — H26492 Other secondary cataract, left eye: Secondary | ICD-10-CM | POA: Diagnosis not present

## 2024-06-15 DIAGNOSIS — H401133 Primary open-angle glaucoma, bilateral, severe stage: Secondary | ICD-10-CM | POA: Diagnosis not present

## 2024-06-29 DIAGNOSIS — H401122 Primary open-angle glaucoma, left eye, moderate stage: Secondary | ICD-10-CM | POA: Diagnosis not present

## 2024-06-29 DIAGNOSIS — H26493 Other secondary cataract, bilateral: Secondary | ICD-10-CM | POA: Diagnosis not present

## 2024-06-29 DIAGNOSIS — H26492 Other secondary cataract, left eye: Secondary | ICD-10-CM | POA: Diagnosis not present

## 2024-10-05 ENCOUNTER — Other Ambulatory Visit: Payer: Self-pay | Admitting: Family Medicine

## 2025-01-18 ENCOUNTER — Ambulatory Visit
